# Patient Record
Sex: Female | Born: 1940
Health system: Southern US, Community
[De-identification: ages and names within clinical notes are randomized; demographics above are authoritative.]

## PROBLEM LIST (undated history)

## (undated) DIAGNOSIS — I499 Cardiac arrhythmia, unspecified: Secondary | ICD-10-CM

## (undated) DIAGNOSIS — F32A Depression, unspecified: Secondary | ICD-10-CM

## (undated) DIAGNOSIS — K219 Gastro-esophageal reflux disease without esophagitis: Secondary | ICD-10-CM

## (undated) DIAGNOSIS — G4700 Insomnia, unspecified: Secondary | ICD-10-CM

## (undated) DIAGNOSIS — F419 Anxiety disorder, unspecified: Secondary | ICD-10-CM

## (undated) DIAGNOSIS — K589 Irritable bowel syndrome without diarrhea: Secondary | ICD-10-CM

## (undated) DIAGNOSIS — Z8489 Family history of other specified conditions: Secondary | ICD-10-CM

## (undated) DIAGNOSIS — E7800 Pure hypercholesterolemia, unspecified: Secondary | ICD-10-CM

## (undated) DIAGNOSIS — M1990 Unspecified osteoarthritis, unspecified site: Secondary | ICD-10-CM

## (undated) DIAGNOSIS — I739 Peripheral vascular disease, unspecified: Secondary | ICD-10-CM

## (undated) HISTORY — DX: Gastro-esophageal reflux disease without esophagitis: K21.9

## (undated) HISTORY — PX: KNEE SURGERY: SHX244

## (undated) HISTORY — DX: Peripheral vascular disease, unspecified: I73.9

## (undated) HISTORY — DX: Pure hypercholesterolemia, unspecified: E78.00

## (undated) HISTORY — DX: Irritable bowel syndrome, unspecified: K58.9

## (undated) HISTORY — DX: Insomnia, unspecified: G47.00

## (undated) HISTORY — DX: Anxiety disorder, unspecified: F41.9

---

## 1975-06-06 HISTORY — PX: APPENDECTOMY: SHX54

## 1997-11-23 ENCOUNTER — Other Ambulatory Visit: Admission: RE | Admit: 1997-11-23 | Discharge: 1997-11-23 | Payer: Self-pay | Admitting: Gynecology

## 1998-03-05 ENCOUNTER — Other Ambulatory Visit: Admission: RE | Admit: 1998-03-05 | Discharge: 1998-03-05 | Payer: Self-pay | Admitting: Obstetrics and Gynecology

## 1998-12-04 ENCOUNTER — Other Ambulatory Visit: Admission: RE | Admit: 1998-12-04 | Discharge: 1998-12-04 | Payer: Self-pay | Admitting: Gynecology

## 1999-08-28 HISTORY — PX: ANTERIOR FUSION CERVICAL SPINE: SUR626

## 1999-12-24 ENCOUNTER — Other Ambulatory Visit: Admission: RE | Admit: 1999-12-24 | Discharge: 1999-12-24 | Payer: Self-pay | Admitting: Gynecology

## 2001-01-18 ENCOUNTER — Other Ambulatory Visit: Admission: RE | Admit: 2001-01-18 | Discharge: 2001-01-18 | Payer: Self-pay | Admitting: Gynecology

## 2002-01-18 ENCOUNTER — Other Ambulatory Visit: Admission: RE | Admit: 2002-01-18 | Discharge: 2002-01-18 | Payer: Self-pay | Admitting: Gynecology

## 2002-05-12 ENCOUNTER — Ambulatory Visit (HOSPITAL_BASED_OUTPATIENT_CLINIC_OR_DEPARTMENT_OTHER): Admission: RE | Admit: 2002-05-12 | Discharge: 2002-05-12 | Payer: Self-pay | Admitting: *Deleted

## 2003-01-30 ENCOUNTER — Other Ambulatory Visit: Admission: RE | Admit: 2003-01-30 | Discharge: 2003-01-30 | Payer: Self-pay | Admitting: Obstetrics & Gynecology

## 2004-02-05 ENCOUNTER — Other Ambulatory Visit: Admission: RE | Admit: 2004-02-05 | Discharge: 2004-02-05 | Payer: Self-pay | Admitting: Gynecology

## 2005-02-06 ENCOUNTER — Other Ambulatory Visit: Admission: RE | Admit: 2005-02-06 | Discharge: 2005-02-06 | Payer: Self-pay | Admitting: Gynecology

## 2005-12-30 ENCOUNTER — Encounter: Admission: RE | Admit: 2005-12-30 | Discharge: 2005-12-30 | Payer: Self-pay | Admitting: Orthopaedic Surgery

## 2006-01-13 ENCOUNTER — Encounter: Admission: RE | Admit: 2006-01-13 | Discharge: 2006-01-13 | Payer: Self-pay | Admitting: Orthopaedic Surgery

## 2007-02-12 ENCOUNTER — Encounter: Admission: RE | Admit: 2007-02-12 | Discharge: 2007-02-12 | Payer: Self-pay | Admitting: Gynecology

## 2007-02-18 ENCOUNTER — Encounter: Admission: RE | Admit: 2007-02-18 | Discharge: 2007-02-18 | Payer: Self-pay | Admitting: Gynecology

## 2008-02-14 ENCOUNTER — Encounter: Admission: RE | Admit: 2008-02-14 | Discharge: 2008-02-14 | Payer: Self-pay | Admitting: Gynecology

## 2008-03-23 ENCOUNTER — Encounter: Admission: RE | Admit: 2008-03-23 | Discharge: 2008-03-23 | Payer: Self-pay | Admitting: Orthopaedic Surgery

## 2008-04-10 HISTORY — PX: LIPOMA EXCISION: SHX5283

## 2009-02-14 ENCOUNTER — Encounter: Admission: RE | Admit: 2009-02-14 | Discharge: 2009-02-14 | Payer: Self-pay | Admitting: Gynecology

## 2010-02-19 ENCOUNTER — Encounter
Admission: RE | Admit: 2010-02-19 | Discharge: 2010-02-19 | Payer: Self-pay | Source: Home / Self Care | Attending: Gynecology | Admitting: Gynecology

## 2010-06-14 NOTE — Op Note (Signed)
   NAME:  Penny Henry, Penny Henry                          ACCOUNT NO.:  [id number]   MEDICAL RECORD NO.:  [id number]                   PATIENT TYPE:  AMB   LOCATION:  DSC                                  FACILITY:  [facility]   PHYSICIAN:  Lowell Bouton, M.D.      DATE OF BIRTH:  01/11/1941   DATE OF PROCEDURE:  05/12/2002  DATE OF DISCHARGE:                                 OPERATIVE REPORT   PREOPERATIVE DIAGNOSIS:  Right carpal tunnel syndrome.   POSTOPERATIVE DIAGNOSIS:  Right carpal tunnel syndrome.   PROCEDURE:  Decompression, median nerve, right carpal tunnel.   SURGEON:  Lowell Bouton, M.D.   ANESTHESIA:  0.5% Marcaine local with sedation.   OPERATIVE FINDINGS:  The patient had no masses in the carpal canal.  The  motor branch of the nerve was intact.   DESCRIPTION OF PROCEDURE:  Under 0.5% Marcaine local anesthesia with a  tourniquet on the right arm, the right hand was prepped and draped in the  usual fashion and after exsanguinating the limb, the tourniquet was inflated  to 225 mmHg.  A 3 cm longitudinal incision was made in the palm just ulnar  to the thenar crease.  Sharp dissection was carried through the subcutaneous  tissues.  Blunt dissection was carried to the superficial palmar fascia just  distal to the transverse carpal ligament.  A hemostat was then placed in the  carpal canal up against the hook of the hamate.  The transverse carpal  ligament was divided on the ulnar border of the median nerve.  The proximal  end of the ligament was divided with the scissors after dissecting the nerve  away from the undersurface of the ligament.  The carpal canal was then  palpated and was found to be adequately decompressed.  The wound was  irrigated with saline.  The nerve was examined and the motor branch  identified.  The skin was closed with 4-0 nylon sutures.  Sterile dressings  were applied, followed by a volar wrist splint.  The tourniquet was released  with good circulation to the hand.  The patient went to the recovery room  awake and stable, in good condition.                                               Lowell Bouton, M.D.    EMM/MEDQ  D:  05/13/2002  T:  05/13/2002  Job:  [id number]

## 2011-01-14 ENCOUNTER — Other Ambulatory Visit: Payer: Self-pay | Admitting: Gynecology

## 2011-01-14 DIAGNOSIS — Z1231 Encounter for screening mammogram for malignant neoplasm of breast: Secondary | ICD-10-CM

## 2011-02-21 ENCOUNTER — Ambulatory Visit
Admission: RE | Admit: 2011-02-21 | Discharge: 2011-02-21 | Disposition: A | Discharge status: Home / Self Care | Payer: BC Managed Care – PPO | Source: Ambulatory Visit | Attending: Gynecology | Admitting: Gynecology

## 2011-02-21 DIAGNOSIS — Z1231 Encounter for screening mammogram for malignant neoplasm of breast: Secondary | ICD-10-CM

## 2011-10-02 HISTORY — PX: KNEE ARTHROSCOPY: SUR90

## 2012-02-05 ENCOUNTER — Other Ambulatory Visit: Payer: Self-pay | Admitting: Gynecology

## 2012-02-05 DIAGNOSIS — Z1231 Encounter for screening mammogram for malignant neoplasm of breast: Secondary | ICD-10-CM

## 2012-03-05 ENCOUNTER — Ambulatory Visit
Admission: RE | Admit: 2012-03-05 | Discharge: 2012-03-05 | Disposition: A | Discharge status: Home / Self Care | Payer: BC Managed Care – PPO | Source: Ambulatory Visit | Attending: Gynecology | Admitting: Gynecology

## 2012-03-05 DIAGNOSIS — Z1231 Encounter for screening mammogram for malignant neoplasm of breast: Secondary | ICD-10-CM

## 2013-02-08 ENCOUNTER — Other Ambulatory Visit: Payer: Self-pay

## 2013-02-08 DIAGNOSIS — Z1231 Encounter for screening mammogram for malignant neoplasm of breast: Secondary | ICD-10-CM

## 2013-02-10 ENCOUNTER — Other Ambulatory Visit: Payer: Self-pay | Admitting: Gynecology

## 2013-02-10 DIAGNOSIS — Z780 Asymptomatic menopausal state: Secondary | ICD-10-CM

## 2013-03-08 ENCOUNTER — Ambulatory Visit
Admission: RE | Admit: 2013-03-08 | Discharge: 2013-03-08 | Disposition: A | Discharge status: Home / Self Care | Payer: BC Managed Care – PPO | Source: Ambulatory Visit

## 2013-03-08 ENCOUNTER — Ambulatory Visit
Admission: RE | Admit: 2013-03-08 | Discharge: 2013-03-08 | Disposition: A | Discharge status: Home / Self Care | Payer: BC Managed Care – PPO | Source: Ambulatory Visit | Attending: Gynecology | Admitting: Gynecology

## 2013-03-08 ENCOUNTER — Other Ambulatory Visit: Payer: Self-pay

## 2013-03-08 DIAGNOSIS — Z1231 Encounter for screening mammogram for malignant neoplasm of breast: Secondary | ICD-10-CM

## 2013-03-08 DIAGNOSIS — Z780 Asymptomatic menopausal state: Secondary | ICD-10-CM

## 2013-03-11 ENCOUNTER — Other Ambulatory Visit: Payer: Self-pay | Admitting: Gynecology

## 2013-03-11 DIAGNOSIS — R928 Other abnormal and inconclusive findings on diagnostic imaging of breast: Secondary | ICD-10-CM

## 2013-03-23 ENCOUNTER — Ambulatory Visit
Admission: RE | Admit: 2013-03-23 | Discharge: 2013-03-23 | Disposition: A | Discharge status: Home / Self Care | Payer: BC Managed Care – PPO | Source: Ambulatory Visit | Attending: Gynecology | Admitting: Gynecology

## 2013-03-23 DIAGNOSIS — R928 Other abnormal and inconclusive findings on diagnostic imaging of breast: Secondary | ICD-10-CM

## 2014-02-22 ENCOUNTER — Other Ambulatory Visit: Payer: Self-pay

## 2014-02-22 DIAGNOSIS — Z1231 Encounter for screening mammogram for malignant neoplasm of breast: Secondary | ICD-10-CM

## 2014-03-14 ENCOUNTER — Ambulatory Visit
Admission: RE | Admit: 2014-03-14 | Discharge: 2014-03-14 | Disposition: A | Discharge status: Home / Self Care | Payer: BC Managed Care – PPO | Source: Ambulatory Visit

## 2014-03-14 DIAGNOSIS — Z1231 Encounter for screening mammogram for malignant neoplasm of breast: Secondary | ICD-10-CM

## 2014-08-03 DIAGNOSIS — I471 Supraventricular tachycardia: Secondary | ICD-10-CM | POA: Insufficient documentation

## 2014-08-03 DIAGNOSIS — I493 Ventricular premature depolarization: Secondary | ICD-10-CM | POA: Insufficient documentation

## 2015-02-20 ENCOUNTER — Other Ambulatory Visit: Payer: Self-pay

## 2015-02-20 DIAGNOSIS — Z1231 Encounter for screening mammogram for malignant neoplasm of breast: Secondary | ICD-10-CM

## 2015-03-02 ENCOUNTER — Other Ambulatory Visit: Payer: Self-pay | Admitting: Obstetrics and Gynecology

## 2015-03-02 DIAGNOSIS — E2839 Other primary ovarian failure: Secondary | ICD-10-CM

## 2015-03-16 ENCOUNTER — Ambulatory Visit
Admission: RE | Admit: 2015-03-16 | Discharge: 2015-03-16 | Disposition: A | Discharge status: Home / Self Care | Payer: BC Managed Care – PPO | Source: Ambulatory Visit

## 2015-03-16 ENCOUNTER — Ambulatory Visit
Admission: RE | Admit: 2015-03-16 | Discharge: 2015-03-16 | Disposition: A | Discharge status: Home / Self Care | Payer: BC Managed Care – PPO | Source: Ambulatory Visit | Attending: Obstetrics and Gynecology | Admitting: Obstetrics and Gynecology

## 2015-03-16 DIAGNOSIS — E2839 Other primary ovarian failure: Secondary | ICD-10-CM

## 2015-03-16 DIAGNOSIS — Z1231 Encounter for screening mammogram for malignant neoplasm of breast: Secondary | ICD-10-CM

## 2015-03-22 ENCOUNTER — Other Ambulatory Visit: Payer: Self-pay | Admitting: Obstetrics and Gynecology

## 2015-03-22 DIAGNOSIS — R928 Other abnormal and inconclusive findings on diagnostic imaging of breast: Secondary | ICD-10-CM

## 2015-03-29 ENCOUNTER — Ambulatory Visit
Admission: RE | Admit: 2015-03-29 | Discharge: 2015-03-29 | Disposition: A | Discharge status: Home / Self Care | Payer: BC Managed Care – PPO | Source: Ambulatory Visit | Attending: Obstetrics and Gynecology | Admitting: Obstetrics and Gynecology

## 2015-03-29 DIAGNOSIS — R928 Other abnormal and inconclusive findings on diagnostic imaging of breast: Secondary | ICD-10-CM

## 2016-03-06 ENCOUNTER — Other Ambulatory Visit: Payer: Self-pay | Admitting: Family Medicine

## 2016-03-06 DIAGNOSIS — Z1231 Encounter for screening mammogram for malignant neoplasm of breast: Secondary | ICD-10-CM

## 2016-03-17 ENCOUNTER — Ambulatory Visit
Admission: RE | Admit: 2016-03-17 | Discharge: 2016-03-17 | Disposition: A | Discharge status: Home / Self Care | Payer: Medicare Other | Source: Ambulatory Visit | Attending: Family Medicine | Admitting: Family Medicine

## 2016-03-17 DIAGNOSIS — Z1231 Encounter for screening mammogram for malignant neoplasm of breast: Secondary | ICD-10-CM

## 2017-03-09 ENCOUNTER — Other Ambulatory Visit: Payer: Self-pay | Admitting: Obstetrics and Gynecology

## 2017-03-09 DIAGNOSIS — E2839 Other primary ovarian failure: Secondary | ICD-10-CM

## 2017-03-09 DIAGNOSIS — Z1231 Encounter for screening mammogram for malignant neoplasm of breast: Secondary | ICD-10-CM

## 2017-03-27 ENCOUNTER — Ambulatory Visit
Admission: RE | Admit: 2017-03-27 | Discharge: 2017-03-27 | Disposition: A | Discharge status: Home / Self Care | Payer: Medicare Other | Source: Ambulatory Visit | Attending: Obstetrics and Gynecology | Admitting: Obstetrics and Gynecology

## 2017-03-27 DIAGNOSIS — Z1231 Encounter for screening mammogram for malignant neoplasm of breast: Secondary | ICD-10-CM

## 2017-03-27 DIAGNOSIS — E2839 Other primary ovarian failure: Secondary | ICD-10-CM

## 2017-05-04 ENCOUNTER — Encounter (INDEPENDENT_AMBULATORY_CARE_PROVIDER_SITE_OTHER): Payer: Self-pay | Admitting: Vascular Surgery

## 2017-08-31 ENCOUNTER — Telehealth (INDEPENDENT_AMBULATORY_CARE_PROVIDER_SITE_OTHER): Payer: Self-pay | Admitting: Orthopaedic Surgery

## 2017-08-31 NOTE — Telephone Encounter (Signed)
Patient called asked if she can get another injection in both knees. Patient said she do not know what injections were used. The number to contact patient is [phone]

## 2017-08-31 NOTE — Telephone Encounter (Signed)
Yes that's fine, it was just cortisone injections

## 2017-08-31 NOTE — Telephone Encounter (Signed)
Patient scheduled 09/16/17 with Dr Magnus IvanBlackman

## 2017-09-08 DIAGNOSIS — J309 Allergic rhinitis, unspecified: Secondary | ICD-10-CM | POA: Insufficient documentation

## 2017-09-15 NOTE — Progress Notes (Signed)
Cardiology Office Note:    Date:  09/16/2017   ID:  Penny HartClara L Osburn, DOB 07-06-1940, MRN [id number]  PCP:  Marylynn PearsonBurkhart, Chantee Cerino, MD  Cardiologist:  Norman HerrlichBrian Takita Riecke, MD    Referring MD: Marylynn PearsonBurkhart, Raiden Haydu, MD    ASSESSMENT:    1. PVC (premature ventricular contraction)   2. SVT (supraventricular tachycardia) (HCC)   3. Venous stasis dermatitis of both lower extremities    PLAN:    In order of problems listed above:  1. Stable she has minimal symptoms is pleased with the response to over-the-counter magnesium at this time does not require beta-blocker antiarrhythmic drug.  I asked her to avoid over-the-counter proarrhythmic agents 2. Stable no recurrence off suppressant therapy 3. Stable I gave her elements of care including emollient elevation and compression hose   Next appointment: One year   Medication Adjustments/Labs and Tests Ordered: Current medicines are reviewed at length with the patient today.  Concerns regarding medicines are outlined above.  No orders of the defined types were placed in this encounter.  No orders of the defined types were placed in this encounter.   Chief Complaint  Patient presents with  . Follow-up    PVC's and SVT    History of Present Illness:    Penny Henry is a 77 y.o. female with a hx of SVT PVCs not on suppressant therapy  and palpitation [facility]. Compliance with diet, lifestyle and medications: yes  She has brief momentary palpitation but not frequent or sustained continues to take over-the-counter magnesium that she thinks is helped her.  No chest pain shortness of breath or syncope Past Medical History:  Diagnosis Date  . Acid reflux disease   . Anxiety   . Borderline hypercholesterolemia   . Insomnia   . Irritable bowel syndrome     Past Surgical History:  Procedure Laterality Date  . KNEE SURGERY      Current Medications: Current Meds  Medication Sig  . aspirin EC 81 MG tablet  Take 81 mg by mouth daily.   . Azelastine HCl 0.15 % SOLN   . Bioflavonoid Products (ESTER C PO) Take 1 tablet by mouth daily.   . cetirizine (ZYRTEC) 10 MG tablet Take 10 mg by mouth daily as needed.   . Cholecalciferol (VITAMIN D-3) 5000 units TABS Take 1 tablet by mouth daily.  Marland Kitchen. dicyclomine (BENTYL) 20 MG tablet Take 20 mg by mouth 4 (four) times daily as needed.   Marland Kitchen. escitalopram (LEXAPRO) 10 MG tablet escitalopram 10 mg tablet  . estradiol (ESTRACE) 1 MG tablet estradiol 1 mg tablet  Take 1 tablet(s) every day by oral route.  Marland Kitchen. FIBER ADULT GUMMIES PO Take 1 tablet by mouth daily.  . fluocinonide gel (LIDEX) 0.05 % fluocinonide 0.05 % topical gel  . Glucosamine Sulfate 1000 MG CAPS Take by mouth.  Marland Kitchen. LORazepam (ATIVAN) 1 MG tablet Take 1.5 mg by mouth daily.  . Magnesium 500 MG CAPS Take 1 capsule by mouth daily.   . Multiple Vitamin (MULTI-VITAMINS PO) Take by mouth.  . multivitamin-lutein (OCUVITE-LUTEIN) CAPS capsule Take 1 capsule by mouth daily.  . Omega-3 Fatty Acids (FISH OIL) 1200 MG CPDR Take 1 tablet by mouth 2 (two) times daily.   . progesterone (PROMETRIUM) 100 MG capsule Take 100 mg by mouth daily.  . traMADol (ULTRAM) 50 MG tablet Take by mouth every 6 (six) hours as needed.  . traZODone (DESYREL) 50 MG tablet Take 50 mg by mouth daily.  Allergies:   Doxycycline; Doxycycline hyclate; and Povidone iodine   Social History   Socioeconomic History  . Marital status: Single    Spouse name: Not on file  . Number of children: Not on file  . Years of education: Not on file  . Highest education level: Not on file  Occupational History  . Not on file  Social Needs  . Financial resource strain: Not on file  . Food insecurity:    Worry: Not on file    Inability: Not on file  . Transportation needs:    Medical: Not on file    Non-medical: Not on file  Tobacco Use  . Smoking status: Never Smoker  . Smokeless tobacco: Never Used  Substance and Sexual Activity  .  Alcohol use: Not Currently  . Drug use: Not Currently  . Sexual activity: Not on file  Lifestyle  . Physical activity:    Days per week: Not on file    Minutes per session: Not on file  . Stress: Not on file  Relationships  . Social connections:    Talks on phone: Not on file    Gets together: Not on file    Attends religious service: Not on file    Active member of club or organization: Not on file    Attends meetings of clubs or organizations: Not on file    Relationship status: Not on file  Other Topics Concern  . Not on file  Social History Narrative  . Not on file     Family History: The patient's family history includes Arthritis in her father and mother. ROS:   Please see the history of present illness.    All other systems reviewed and are negative.  EKGs/Labs/Other Studies Reviewed:    The following studies were reviewed today:  EKG:  EKG ordered today.  [facility] and [facility]  Recent Labs: No results found for requested labs within last 8760 hours.  Recent Lipid Panel No results found for: CHOL, TRIG, HDL, CHOLHDL, VLDL, LDLCALC, LDLDIRECT  Physical Exam:    VS:  BP 122/78 (BP Location: Right Arm, Patient Position: Sitting, Cuff Size: Normal)   Pulse 79   Ht 5\' 7"  (1.702 m)   Wt 140 lb (63.5 kg)   SpO2 96%   BMI 21.93 kg/m     Wt Readings from Last 3 Encounters:  09/16/17 140 lb (63.5 kg)     GEN:  Well nourished, well developed in no acute distress HEENT: Normal NECK: No JVD; No carotid bruits LYMPHATICS: No lymphadenopathy CARDIAC: RRR, no murmurs, rubs, gallops RESPIRATORY:  Clear to auscultation without rales, wheezing or rhonchi  ABDOMEN: Soft, non-tender, non-distended MUSCULOSKELETAL:  No edema; No deformity  SKIN: Warm and dry NEUROLOGIC:  Alert and oriented x 3 PSYCHIATRIC:  Normal affect    Signed, Norman HerrlichBrian Mikhail Hallenbeck, MD  09/16/2017 2:47 PM    [facility]

## 2017-09-16 ENCOUNTER — Ambulatory Visit (INDEPENDENT_AMBULATORY_CARE_PROVIDER_SITE_OTHER): Payer: Medicare Other

## 2017-09-16 ENCOUNTER — Ambulatory Visit (INDEPENDENT_AMBULATORY_CARE_PROVIDER_SITE_OTHER): Payer: Medicare Other | Admitting: Orthopaedic Surgery

## 2017-09-16 ENCOUNTER — Ambulatory Visit: Payer: Medicare Other | Admitting: Cardiology

## 2017-09-16 ENCOUNTER — Encounter: Payer: Self-pay | Admitting: Cardiology

## 2017-09-16 ENCOUNTER — Telehealth (INDEPENDENT_AMBULATORY_CARE_PROVIDER_SITE_OTHER): Payer: Self-pay

## 2017-09-16 VITALS — BP 122/78 | HR 79 | Ht 67.0 in | Wt 140.0 lb

## 2017-09-16 DIAGNOSIS — I493 Ventricular premature depolarization: Secondary | ICD-10-CM | POA: Diagnosis not present

## 2017-09-16 DIAGNOSIS — G8929 Other chronic pain: Secondary | ICD-10-CM

## 2017-09-16 DIAGNOSIS — I471 Supraventricular tachycardia: Secondary | ICD-10-CM

## 2017-09-16 DIAGNOSIS — M25562 Pain in left knee: Secondary | ICD-10-CM

## 2017-09-16 DIAGNOSIS — I872 Venous insufficiency (chronic) (peripheral): Secondary | ICD-10-CM | POA: Diagnosis not present

## 2017-09-16 DIAGNOSIS — M1712 Unilateral primary osteoarthritis, left knee: Secondary | ICD-10-CM | POA: Diagnosis not present

## 2017-09-16 NOTE — Progress Notes (Signed)
6+                                                                                                                                                                         Office Visit Note   Patient: Penny Henry           Date of Birth: [birth year]           MRN: [id number] Visit Date: 09/16/2017              Requested by: Marylynn PearsonBurkhart, Brian, MD [address] [place], [place] [address] PCP: Marylynn PearsonBurkhart, Brian, MD   Assessment & Plan: Visit Diagnoses:  1. Chronic pain of left knee   2. Unilateral primary osteoarthritis, left knee     Plan: She is going to continue work on quad strengthening exercises and activity modification.  She is a perfect candidate for steroid injection today in her knee and then a hyaluronic acid injection in her left knee.  This combination is helped significantly in the past and is kept her out of the operating room.  I do feel is medically necessary at this point as well.  She understands the risk and benefits of these injections and tolerated the one in her left knee today with a steroid.  In 4 weeks from now hopefully will place hyaluronic acid in her left knee.  Follow-Up Instructions: Return in about 4 weeks (around 10/14/2017).   Orders:  Orders Placed This Encounter  Procedures  . XR Knee 1-2 Views Left   No orders of the defined types were placed in this encounter.     Procedures: No procedures performed   Clinical Data: No additional findings.   Subjective: Chief Complaint  Patient presents with  . Left Knee - Pain  The patient is someone I have not seen in a while.  She is an acute flareup of knee pain of her left knee.  We have seen her before for this but is been about 2 years.  She is had steroid injections and hyaluronic acid in the past.  She said there is no locking catching but  there is pain and popping in her knee when she goes up and down stairs.  Unfortunately her husband's been diagnosed with prostate cancer and does not have a lot of treatments over the next several weeks and this will require much more walking for her and taking care of him.  HPI  Review of Systems She currently denies any headache, chest pain, shortness of breath, fever, chills, nausea, vomiting.  Objective: Vital Signs: There were no vitals taken for this visit.  Physical Exam She is alert and oriented x3 and in no acute distress Ortho Exam Examination of her left knee  shows lateral joint line tenderness and patellofemoral crepitation with full range of motion.  There is slight valgus malalignment with lateral joint line tenderness.  Knee is ligamentously stable. Specialty Comments:  No specialty comments available.  Imaging: Xr Knee 1-2 Views Left  Result Date: 09/16/2017 2 views of the left knee show no acute findings.  There is mild valgus malalignment with lateral joint space narrowing.  There are periarticular osteophytes throughout the knee and evidence of tricompartmental arthritic changes.    PMFS History: Patient Active Problem List   Diagnosis Date Noted  . Unilateral primary osteoarthritis, left knee 09/16/2017  . Allergic rhinitis 09/08/2017  . PVC (premature ventricular contraction) 08/03/2014  . SVT (supraventricular tachycardia) (HCC) 08/03/2014   Past Medical History:  Diagnosis Date  . Acid reflux disease   . Anxiety   . Borderline hypercholesterolemia   . Insomnia   . Irritable bowel syndrome     Family History  Problem Relation Age of Onset  . Arthritis Mother   . Arthritis Father     Past Surgical History:  Procedure Laterality Date  . KNEE SURGERY     Social History   Occupational History  . Not on file  Tobacco Use  . Smoking status: Never Smoker  . Smokeless tobacco: Never Used  Substance and Sexual Activity  . Alcohol use: Not on file    . Drug use: Not on file  . Sexual activity: Not on file

## 2017-09-16 NOTE — Patient Instructions (Addendum)
Medication Instructions:  Your physician recommends that you continue on your current medications as directed. Please refer to the Current Medication list given to you today.   Labwork: None.  Testing/Procedures: None.  Follow-Up: Your physician wants you to follow-up in: 1 year. You will receive a reminder letter in the mail two months in advance. If you don't receive a letter, please call our office to schedule the follow-up appointment.   Any Other Special Instructions Will Be Listed Below (If Applicable).     If you need a refill on your cardiac medications before your next appointment, please call your pharmacy.     1. Avoid all over-the-counter antihistamines except Claritin/Loratadine and Zyrtec/Cetrizine. 2. Avoid all combination including cold sinus allergies flu decongestant and sleep medications 3. You can use Robitussin DM Mucinex and Mucinex DM for cough. 4. can use Tylenol aspirin ibuprofen and naproxen but no combinations such as sleep or sinus. 

## 2017-09-16 NOTE — Telephone Encounter (Signed)
Left knee gel injection ?

## 2017-09-21 ENCOUNTER — Telehealth (INDEPENDENT_AMBULATORY_CARE_PROVIDER_SITE_OTHER): Payer: Self-pay

## 2017-09-21 NOTE — Telephone Encounter (Signed)
Noted  

## 2017-09-21 NOTE — Telephone Encounter (Signed)
Submitted VOB for SynviscOne, left knee. 

## 2017-09-29 ENCOUNTER — Telehealth (INDEPENDENT_AMBULATORY_CARE_PROVIDER_SITE_OTHER): Payer: Self-pay

## 2017-09-29 NOTE — Telephone Encounter (Signed)
Patient is approved for SynviscOne, left knee. Buy & Bill Covered at 100% after co-pay $90.00 Co-pay No PA required  Appt.scheduled 10/14/2017

## 2017-10-14 ENCOUNTER — Ambulatory Visit (INDEPENDENT_AMBULATORY_CARE_PROVIDER_SITE_OTHER): Payer: Medicare Other | Admitting: Orthopaedic Surgery

## 2017-10-14 ENCOUNTER — Encounter (INDEPENDENT_AMBULATORY_CARE_PROVIDER_SITE_OTHER): Payer: Self-pay | Admitting: Orthopaedic Surgery

## 2017-10-14 DIAGNOSIS — M25562 Pain in left knee: Secondary | ICD-10-CM | POA: Diagnosis not present

## 2017-10-14 DIAGNOSIS — M1712 Unilateral primary osteoarthritis, left knee: Secondary | ICD-10-CM | POA: Diagnosis not present

## 2017-10-14 DIAGNOSIS — G8929 Other chronic pain: Secondary | ICD-10-CM | POA: Insufficient documentation

## 2017-10-14 MED ORDER — HYLAN G-F 20 48 MG/6ML IX SOSY
48.0000 mg | PREFILLED_SYRINGE | INTRA_ARTICULAR | Status: AC | PRN
  Administered 2017-10-14: 48 mg via INTRA_ARTICULAR

## 2017-10-14 NOTE — Progress Notes (Signed)
   Procedure Note  Patient: Penny Henry             Date of Birth: 03/22/1940           MRN: [id number]             Visit Date: 10/14/2017  Procedures: Visit Diagnoses: Chronic pain of left knee  Unilateral primary osteoarthritis, left knee  Large Joint Inj: L knee on 10/14/2017 2:03 PM Indications: pain and diagnostic evaluation Details: 22 G 1.5 in needle, superolateral approach  Arthrogram: No  Medications: 48 mg Hylan 48 MG/6ML Outcome: tolerated well, no immediate complications Procedure, treatment alternatives, risks and benefits explained, specific risks discussed. Consent was given by the patient. Immediately prior to procedure a time out was called to verify the correct patient, procedure, equipment, support staff and site/side marked as required. Patient was prepped and draped in the usual sterile fashion.    The patient is here for scheduled Synvisc 1 injection her left knee to treat the pain from osteoarthritis.  She is had injections like this in the past that have worked greatly.  She is only had a steroid injection.  She denies any swelling today.  She does has pain with activities.  She has well-documented osteoarthritis of her left knee.  On exam there is no effusion on her knee tracks well in terms of the patella.  She has full flexion extension as well.  She does have medial joint line tenderness.  She tolerated the Synvisc 1 injection well.  All question concerns were answered and addressed.  Follow-up will be as needed.

## 2018-03-01 ENCOUNTER — Other Ambulatory Visit: Payer: Self-pay | Admitting: Obstetrics and Gynecology

## 2018-03-01 DIAGNOSIS — Z1231 Encounter for screening mammogram for malignant neoplasm of breast: Secondary | ICD-10-CM

## 2018-03-30 ENCOUNTER — Ambulatory Visit
Admission: RE | Admit: 2018-03-30 | Discharge: 2018-03-30 | Disposition: A | Discharge status: Home / Self Care | Payer: Medicare Other | Source: Ambulatory Visit | Attending: Obstetrics and Gynecology | Admitting: Obstetrics and Gynecology

## 2018-03-30 DIAGNOSIS — Z1231 Encounter for screening mammogram for malignant neoplasm of breast: Secondary | ICD-10-CM

## 2018-05-19 ENCOUNTER — Ambulatory Visit (INDEPENDENT_AMBULATORY_CARE_PROVIDER_SITE_OTHER): Payer: Medicare Other | Admitting: Orthopaedic Surgery

## 2018-06-07 ENCOUNTER — Encounter: Payer: Self-pay | Admitting: Orthopaedic Surgery

## 2018-06-07 ENCOUNTER — Other Ambulatory Visit: Payer: Self-pay

## 2018-06-07 ENCOUNTER — Ambulatory Visit: Payer: Medicare Other | Admitting: Orthopaedic Surgery

## 2018-06-07 VITALS — Ht 67.0 in | Wt 140.0 lb

## 2018-06-07 DIAGNOSIS — M25562 Pain in left knee: Secondary | ICD-10-CM

## 2018-06-07 DIAGNOSIS — G8929 Other chronic pain: Secondary | ICD-10-CM | POA: Diagnosis not present

## 2018-06-07 DIAGNOSIS — M1712 Unilateral primary osteoarthritis, left knee: Secondary | ICD-10-CM

## 2018-06-07 MED ORDER — LIDOCAINE HCL 1 % IJ SOLN
3.0000 mL | INTRAMUSCULAR | Status: AC | PRN
  Administered 2018-06-07: 13:00:00 3 mL

## 2018-06-07 MED ORDER — METHYLPREDNISOLONE ACETATE 40 MG/ML IJ SUSP
40.0000 mg | INTRAMUSCULAR | Status: AC | PRN
  Administered 2018-06-07: 13:00:00 40 mg via INTRA_ARTICULAR

## 2018-06-07 NOTE — Progress Notes (Signed)
Office Visit Note   Patient: Penny Henry           Date of Birth: 1940/03/26           MRN: [id number] Visit Date: 06/07/2018              Requested by: Penny Malta, MD [address] [place], [place] [address] PCP: Penny Malta, MD   Assessment & Plan: Visit Diagnoses:  1. Chronic pain of left knee   2. Unilateral primary osteoarthritis, left knee     Plan: Per her wishes I did provide a steroid injection into her left knee.  Hopefully this will continue to help her for as many months as it has in the past.  Risk benefits of steroid injections were explained in detail.  All question concerns were answered and addressed.  Follow-up will be as needed.  Follow-Up Instructions: Return if symptoms worsen or fail to improve.   Orders:  Orders Placed This Encounter  Procedures   Large Joint Inj: L knee   No orders of the defined types were placed in this encounter.     Procedures: Large Joint Inj: L knee on 06/07/2018 1:27 PM Indications: diagnostic evaluation and pain Details: 22 G 1.5 in needle, superolateral approach  Arthrogram: No  Medications: 3 mL lidocaine 1 %; 40 mg methylPREDNISolone acetate 40 MG/ML Outcome: tolerated well, no immediate complications Procedure, treatment alternatives, risks and benefits explained, specific risks discussed. Consent was given by the patient. Immediately prior to procedure a time out was called to verify the correct patient, procedure, equipment, support staff and site/side marked as required. Patient was prepped and draped in the usual sterile fashion.       Clinical Data: No additional findings.   Subjective: Chief Complaint  Patient presents with   Left Knee - Pain  The patient comes in today with left knee pain and known osteoarthritis in the left knee.  She is 78 years old and very active.  She has been doing a lot of yard work recently due to her husband having significant medical issues.  We last  injected her knee about 8 months ago with a steroid.  She would like to have 1 again today.  She has had no other acute change in her medical status.  She does report significant knee pain and swelling on that left knee.  HPI  Review of Systems She currently denies any headache, chest pain, shortness of breath, fever, chills, nausea, vomiting  Objective: Vital Signs: Ht 5\' 7"  (1.702 m)    Wt 140 lb (63.5 kg)    BMI 21.93 kg/m   Physical Exam She is alert and orient x3 and in no acute distress Ortho Exam Examination of her left knee shows no effusion today but certainly swelling in terms of the bone which is more chronic.  She has good range of motion of that knee but definitely medial and lateral joint line tenderness.  There is no significant malalignment.  The knee feels ligamentously stable with good range of motion. Specialty Comments:  No specialty comments available.  Imaging: No results found.   PMFS History: Patient Active Problem List   Diagnosis Date Noted   Chronic pain of left knee 10/14/2017   Unilateral primary osteoarthritis, left knee 09/16/2017   Stasis dermatitis 09/16/2017   Allergic rhinitis 09/08/2017   PVC (premature ventricular contraction) 08/03/2014   SVT (supraventricular tachycardia) (HCC) 08/03/2014   Past Medical History:  Diagnosis Date   Acid  reflux disease    Anxiety    Borderline hypercholesterolemia    Insomnia    Irritable bowel syndrome     Family History  Problem Relation Age of Onset   Arthritis Mother    Arthritis Father     Past Surgical History:  Procedure Laterality Date   KNEE SURGERY     Social History   Occupational History   Not on file  Tobacco Use   Smoking status: Never Smoker   Smokeless tobacco: Never Used  Substance and Sexual Activity   Alcohol use: Not Currently   Drug use: Not Currently   Sexual activity: Not on file

## 2018-07-28 ENCOUNTER — Ambulatory Visit: Payer: Self-pay

## 2018-07-28 ENCOUNTER — Ambulatory Visit (INDEPENDENT_AMBULATORY_CARE_PROVIDER_SITE_OTHER): Payer: Medicare Other | Admitting: Orthopaedic Surgery

## 2018-07-28 ENCOUNTER — Encounter: Payer: Self-pay | Admitting: Orthopaedic Surgery

## 2018-07-28 ENCOUNTER — Other Ambulatory Visit: Payer: Self-pay

## 2018-07-28 DIAGNOSIS — M25552 Pain in left hip: Secondary | ICD-10-CM

## 2018-07-28 DIAGNOSIS — M79605 Pain in left leg: Secondary | ICD-10-CM | POA: Diagnosis not present

## 2018-07-28 MED ORDER — LIDOCAINE HCL 1 % IJ SOLN
3.0000 mL | INTRAMUSCULAR | Status: AC | PRN
  Administered 2018-07-28: 3 mL

## 2018-07-28 MED ORDER — METHYLPREDNISOLONE 4 MG PO TABS: ORAL_TABLET | ORAL | 0 refills | Status: DC

## 2018-07-28 MED ORDER — METHYLPREDNISOLONE ACETATE 40 MG/ML IJ SUSP
40.0000 mg | INTRAMUSCULAR | Status: AC | PRN
  Administered 2018-07-28: 40 mg via INTRA_ARTICULAR

## 2018-07-28 NOTE — Progress Notes (Signed)
Office Visit Note   Patient: Penny Henry           Date of Birth: 09-08-1940           MRN: [id number] Visit Date: 07/28/2018              Requested by: Buckner MaltaBurgart, Jennifer, MD [address] [place],  [place] [id number] PCP: Buckner MaltaBurgart, Jennifer, MD   Assessment & Plan: Visit Diagnoses:  1. Pain in left hip   2. Pain in left leg     Plan: I want to try a steroid injection today over the trochanteric area and she agreed with this.  I explained the risk and benefits of injections and she tolerated well.  We will also try a 6-day steroid taper to treat the sciatica.  Right now she can adjust follow-up as needed unless things worsen while she still deals with her husband's death.  Follow-Up Instructions: Return if symptoms worsen or fail to improve.   Orders:  Orders Placed This Encounter  Procedures  . Large Joint Inj  . XR HIP UNILAT W OR W/O PELVIS 1V LEFT  . XR Lumbar Spine 2-3 Views   Meds ordered this encounter  Medications  . methylPREDNISolone (MEDROL) 4 MG tablet    Sig: Medrol dose pack. Take as instructed    Dispense:  21 tablet    Refill:  0      Procedures: Large Joint Inj: L greater trochanter on 07/28/2018 4:14 PM Indications: pain and diagnostic evaluation Details: 22 G 1.5 in needle, lateral approach  Arthrogram: No  Medications: 3 mL lidocaine 1 %; 40 mg methylPREDNISolone acetate 40 MG/ML Outcome: tolerated well, no immediate complications Procedure, treatment alternatives, risks and benefits explained, specific risks discussed. Consent was given by the patient. Immediately prior to procedure a time out was called to verify the correct patient, procedure, equipment, support staff and site/side marked as required. Patient was prepped and draped in the usual sterile fashion.       Clinical Data: No additional findings.   Subjective: Chief Complaint  Patient presents with  . Left Hip - Pain  The patient comes in today with chief complaint of left  hip pain of the trochanteric area and sciatica of the left side.  She is unfortunately been dealing with her husband's death who just passed away 1 June.  They have been married for over 60 years.  She denies any groin pain.  Is been rating down her leg at times.  It is been slowly getting worse.  She is not taking medication for this.  She is tearful in the office today as she more and her husband.  She is able to ambulate without an assistive device.  She is younger appearing 78 years old.  She denies any change in bowel bladder function.  HPI  Review of Systems She currently denies any headache, chest pain, shortness of breath, fever, chills, nausea, vomiting  Objective: Vital Signs: There were no vitals taken for this visit.  Physical Exam She is alert and orient x3 and in no acute distress Ortho Exam Examination of her left hip shows pain over the trochanteric area to palpation in the IT band.  She also though does have a positive straight leg raise on the left side.  She has good strength in her bilateral lower extremities and normal sensation. Specialty Comments:  No specialty comments available.  Imaging: Xr Hip Unilat W Or W/o Pelvis 1v Left  Result Date: 07/28/2018  An AP pelvis and lateral left hip shows no acute findings.  The hip joint space is well-maintained and there is no evidence of significant arthritis at all.  Xr Lumbar Spine 2-3 Views  Result Date: 07/28/2018 2 views lumbar spine show a mild degree of scoliosis.  There is significant degenerative disc disease between L3 and L4.    PMFS History: Patient Active Problem List   Diagnosis Date Noted  . Chronic pain of left knee 10/14/2017  . Unilateral primary osteoarthritis, left knee 09/16/2017  . Stasis dermatitis 09/16/2017  . Allergic rhinitis 09/08/2017  . PVC (premature ventricular contraction) 08/03/2014  . SVT (supraventricular tachycardia) ([place]) 08/03/2014   Past Medical History:  Diagnosis Date  . Acid  reflux disease   . Anxiety   . Borderline hypercholesterolemia   . Insomnia   . Irritable bowel syndrome     Family History  Problem Relation Age of Onset  . Arthritis Mother   . Arthritis Father     Past Surgical History:  Procedure Laterality Date  . KNEE SURGERY     Social History   Occupational History  . Not on file  Tobacco Use  . Smoking status: Never Smoker  . Smokeless tobacco: Never Used  Substance and Sexual Activity  . Alcohol use: Not Currently  . Drug use: Not Currently  . Sexual activity: Not on file

## 2018-09-22 ENCOUNTER — Other Ambulatory Visit: Payer: Self-pay | Admitting: Rehabilitation

## 2018-09-22 DIAGNOSIS — M4126 Other idiopathic scoliosis, lumbar region: Secondary | ICD-10-CM

## 2018-10-10 ENCOUNTER — Ambulatory Visit
Admission: RE | Admit: 2018-10-10 | Discharge: 2018-10-10 | Disposition: A | Discharge status: Home / Self Care | Payer: Medicare Other | Source: Ambulatory Visit | Attending: Rehabilitation | Admitting: Rehabilitation

## 2018-10-10 ENCOUNTER — Other Ambulatory Visit: Payer: Self-pay

## 2018-10-10 DIAGNOSIS — M4126 Other idiopathic scoliosis, lumbar region: Secondary | ICD-10-CM

## 2019-02-03 DIAGNOSIS — Z6821 Body mass index (BMI) 21.0-21.9, adult: Secondary | ICD-10-CM | POA: Diagnosis not present

## 2019-02-03 DIAGNOSIS — M6530 Trigger finger, unspecified finger: Secondary | ICD-10-CM | POA: Diagnosis not present

## 2019-02-03 DIAGNOSIS — F4321 Adjustment disorder with depressed mood: Secondary | ICD-10-CM | POA: Diagnosis not present

## 2019-02-16 ENCOUNTER — Ambulatory Visit: Payer: Self-pay

## 2019-02-16 ENCOUNTER — Ambulatory Visit: Payer: Medicare PPO | Admitting: Physician Assistant

## 2019-02-16 ENCOUNTER — Other Ambulatory Visit: Payer: Self-pay

## 2019-02-16 ENCOUNTER — Encounter: Payer: Self-pay | Admitting: Physician Assistant

## 2019-02-16 DIAGNOSIS — M65342 Trigger finger, left ring finger: Secondary | ICD-10-CM

## 2019-02-16 MED ORDER — LIDOCAINE HCL 1 % IJ SOLN
0.5000 mL | INTRAMUSCULAR | Status: AC | PRN
  Administered 2019-02-16: 15:00:00 .5 mL

## 2019-02-16 MED ORDER — METHYLPREDNISOLONE ACETATE 40 MG/ML IJ SUSP
20.0000 mg | INTRAMUSCULAR | Status: AC | PRN
  Administered 2019-02-16: 20 mg

## 2019-02-16 NOTE — Progress Notes (Signed)
   Procedure Note  Patient: Penny Henry             Date of Birth: 05/27/1940           MRN: [id number]             Visit Date: 02/16/2019 HPI: Ms. splenis well-known to Dr. Magnus Ivan service comes in today for left ring finger that is locking up over the last 2 to 3 weeks.  No known injury.  Patient is nondiabetic.  She had a previous trigger finger that did well with the injection.  She feels that it was not there is left ring finger.  Review of systems: Please see HPI otherwise negative or noncontributory.  General: Well-developed well-nourished female no acute distress mood affect appropriate.   Psych: Alert and orient x3 Left hand: Active left ring finger trigger finger.  Palpable nodule at the A1 pulley is tender.  Otherwise no rashes skin lesions ulcerations or other triggering fingers of left hand.  Procedures: Visit Diagnoses:  1. Trigger ring finger of left hand     Hand/UE Inj: L ring A1 for trigger finger on 02/16/2019 3:28 PM Details: 25 G needle, volar approach Medications: 0.5 mL lidocaine 1 %; 20 mg methylPREDNISolone acetate 40 MG/ML Consent was given by the patient. Immediately prior to procedure a time out was called to verify the correct patient, procedure, equipment, support staff and site/side marked as required. Patient was prepped and draped in the usual sterile fashion.     Plan: She will follow-up on an as-needed basis.  Recommend no more than 3 injections for the trigger finger and then would recommend release if it continues.  Questions were encouraged and answered.

## 2019-02-18 DIAGNOSIS — H353132 Nonexudative age-related macular degeneration, bilateral, intermediate dry stage: Secondary | ICD-10-CM | POA: Diagnosis not present

## 2019-02-18 DIAGNOSIS — H2513 Age-related nuclear cataract, bilateral: Secondary | ICD-10-CM | POA: Diagnosis not present

## 2019-03-10 DIAGNOSIS — H2512 Age-related nuclear cataract, left eye: Secondary | ICD-10-CM | POA: Diagnosis not present

## 2019-03-10 DIAGNOSIS — H25812 Combined forms of age-related cataract, left eye: Secondary | ICD-10-CM | POA: Diagnosis not present

## 2019-03-16 ENCOUNTER — Other Ambulatory Visit: Payer: Self-pay | Admitting: Obstetrics and Gynecology

## 2019-03-16 DIAGNOSIS — E2839 Other primary ovarian failure: Secondary | ICD-10-CM

## 2019-03-16 DIAGNOSIS — Z1231 Encounter for screening mammogram for malignant neoplasm of breast: Secondary | ICD-10-CM

## 2019-03-21 HISTORY — PX: CATARACT EXTRACTION W/ INTRAOCULAR LENS IMPLANT: SHX1309

## 2019-03-22 DIAGNOSIS — F4321 Adjustment disorder with depressed mood: Secondary | ICD-10-CM | POA: Diagnosis not present

## 2019-03-22 DIAGNOSIS — K580 Irritable bowel syndrome with diarrhea: Secondary | ICD-10-CM | POA: Diagnosis not present

## 2019-03-22 DIAGNOSIS — Z6821 Body mass index (BMI) 21.0-21.9, adult: Secondary | ICD-10-CM | POA: Diagnosis not present

## 2019-03-22 DIAGNOSIS — K219 Gastro-esophageal reflux disease without esophagitis: Secondary | ICD-10-CM | POA: Diagnosis not present

## 2019-03-22 DIAGNOSIS — M4716 Other spondylosis with myelopathy, lumbar region: Secondary | ICD-10-CM | POA: Diagnosis not present

## 2019-03-24 DIAGNOSIS — M545 Low back pain: Secondary | ICD-10-CM | POA: Diagnosis not present

## 2019-03-24 DIAGNOSIS — Z79899 Other long term (current) drug therapy: Secondary | ICD-10-CM | POA: Diagnosis not present

## 2019-03-24 DIAGNOSIS — M5416 Radiculopathy, lumbar region: Secondary | ICD-10-CM | POA: Diagnosis not present

## 2019-03-24 DIAGNOSIS — Z79891 Long term (current) use of opiate analgesic: Secondary | ICD-10-CM | POA: Diagnosis not present

## 2019-03-24 DIAGNOSIS — M4716 Other spondylosis with myelopathy, lumbar region: Secondary | ICD-10-CM | POA: Diagnosis not present

## 2019-03-24 DIAGNOSIS — G894 Chronic pain syndrome: Secondary | ICD-10-CM | POA: Diagnosis not present

## 2019-04-07 DIAGNOSIS — H35363 Drusen (degenerative) of macula, bilateral: Secondary | ICD-10-CM | POA: Diagnosis not present

## 2019-04-07 DIAGNOSIS — H35453 Secondary pigmentary degeneration, bilateral: Secondary | ICD-10-CM | POA: Diagnosis not present

## 2019-04-07 DIAGNOSIS — H35722 Serous detachment of retinal pigment epithelium, left eye: Secondary | ICD-10-CM | POA: Diagnosis not present

## 2019-04-07 DIAGNOSIS — H5315 Visual distortions of shape and size: Secondary | ICD-10-CM | POA: Diagnosis not present

## 2019-04-07 DIAGNOSIS — Z961 Presence of intraocular lens: Secondary | ICD-10-CM | POA: Diagnosis not present

## 2019-04-07 DIAGNOSIS — H25811 Combined forms of age-related cataract, right eye: Secondary | ICD-10-CM | POA: Diagnosis not present

## 2019-04-07 DIAGNOSIS — H353132 Nonexudative age-related macular degeneration, bilateral, intermediate dry stage: Secondary | ICD-10-CM | POA: Diagnosis not present

## 2019-04-19 DIAGNOSIS — Z9229 Personal history of other drug therapy: Secondary | ICD-10-CM | POA: Diagnosis not present

## 2019-04-19 DIAGNOSIS — F4321 Adjustment disorder with depressed mood: Secondary | ICD-10-CM | POA: Diagnosis not present

## 2019-04-19 DIAGNOSIS — Z6821 Body mass index (BMI) 21.0-21.9, adult: Secondary | ICD-10-CM | POA: Diagnosis not present

## 2019-04-19 DIAGNOSIS — T148XXA Other injury of unspecified body region, initial encounter: Secondary | ICD-10-CM | POA: Diagnosis not present

## 2019-04-19 DIAGNOSIS — K219 Gastro-esophageal reflux disease without esophagitis: Secondary | ICD-10-CM | POA: Diagnosis not present

## 2019-05-25 ENCOUNTER — Ambulatory Visit: Payer: Medicare PPO | Admitting: Orthopaedic Surgery

## 2019-05-25 ENCOUNTER — Other Ambulatory Visit: Payer: Self-pay

## 2019-05-25 ENCOUNTER — Encounter: Payer: Self-pay | Admitting: Orthopaedic Surgery

## 2019-05-25 DIAGNOSIS — G8929 Other chronic pain: Secondary | ICD-10-CM | POA: Diagnosis not present

## 2019-05-25 DIAGNOSIS — M1712 Unilateral primary osteoarthritis, left knee: Secondary | ICD-10-CM

## 2019-05-25 DIAGNOSIS — M25562 Pain in left knee: Secondary | ICD-10-CM

## 2019-05-25 MED ORDER — LIDOCAINE HCL 1 % IJ SOLN
3.0000 mL | INTRAMUSCULAR | Status: AC | PRN
  Administered 2019-05-25: 3 mL

## 2019-05-25 MED ORDER — METHYLPREDNISOLONE ACETATE 40 MG/ML IJ SUSP
40.0000 mg | INTRAMUSCULAR | Status: AC | PRN
  Administered 2019-05-25: 40 mg via INTRA_ARTICULAR

## 2019-05-25 NOTE — Progress Notes (Signed)
Office Visit Note   Patient: Penny Henry           Date of Birth: [birth year]           MRN: [id number] Visit Date: 05/25/2019              Requested by: Serita Grammes, MD [address] [place],   [id number] PCP: Serita Grammes, MD   Assessment & Plan: Visit Diagnoses:  1. Chronic pain of left knee   2. Unilateral primary osteoarthritis, left knee     Plan: I was able to aspirate about 20 cc of clear fluid from the knee.  I then placed a steroid in the left knee joint per her request.  Having had before she is fully aware of the risk benefits of injections.  All questions and concerns were answered and addressed.  Follow-up will be as needed.  Follow-Up Instructions: Return if symptoms worsen or fail to improve.   Orders:  Orders Placed This Encounter  Procedures  . Large Joint Inj   No orders of the defined types were placed in this encounter.     Procedures: Large Joint Inj: L knee on 05/25/2019 2:19 PM Indications: diagnostic evaluation and pain Details: 22 G 1.5 in needle, superolateral approach  Arthrogram: No  Medications: 3 mL lidocaine 1 %; 40 mg methylPREDNISolone acetate 40 MG/ML Outcome: tolerated well, no immediate complications Procedure, treatment alternatives, risks and benefits explained, specific risks discussed. Consent was given by the patient. Immediately prior to procedure a time out was called to verify the correct patient, procedure, equipment, support staff and site/side marked as required. Patient was prepped and draped in the usual sterile fashion.       Clinical Data: No additional findings.   Subjective: Chief Complaint  Patient presents with  . Left Knee - Follow-up  The patient is well-known to me.  She is a 79 year old female that I have been following for many years for left knee pain and osteoarthritis.  It has been at least 11 months since I injected her knee before.  She does report an acute flareup of her left  knee pain for several weeks now with some swelling more so in the evening.  She is now a widower and has a lot more work and is putting more weight through her knees and more activities through her knees.  She has had no acute change in her medical status.  HPI  Review of Systems She currently denies any headache, chest pain, shortness of breath, fever, chills, nausea, vomiting  Objective: Vital Signs: There were no vitals taken for this visit.  Physical Exam She is alert and oriented x3 and in no acute distress Ortho Exam Examination of her left knee does show just a mild effusion.  She has medial lateral joint line tenderness.  Her knee is ligamentously stable with full range of motion. Specialty Comments:  No specialty comments available.  Imaging: No results found.   PMFS History: Patient Active Problem List   Diagnosis Date Noted  . Chronic pain of left knee 10/14/2017  . Unilateral primary osteoarthritis, left knee 09/16/2017  . Stasis dermatitis 09/16/2017  . Allergic rhinitis 09/08/2017  . PVC (premature ventricular contraction) 08/03/2014  . SVT (supraventricular tachycardia) ([place]) 08/03/2014   Past Medical History:  Diagnosis Date  . Acid reflux disease   . Anxiety   . Borderline hypercholesterolemia   . Insomnia   . Irritable bowel syndrome     Family History  Problem Relation Age of Onset  . Arthritis Mother   . Arthritis Father     Past Surgical History:  Procedure Laterality Date  . KNEE SURGERY     Social History   Occupational History  . Not on file  Tobacco Use  . Smoking status: Never Smoker  . Smokeless tobacco: Never Used  Substance and Sexual Activity  . Alcohol use: Not Currently  . Drug use: Not Currently  . Sexual activity: Not on file

## 2019-06-03 ENCOUNTER — Ambulatory Visit
Admission: RE | Admit: 2019-06-03 | Discharge: 2019-06-03 | Disposition: A | Discharge status: Home / Self Care | Payer: Medicare PPO | Source: Ambulatory Visit | Attending: Obstetrics and Gynecology | Admitting: Obstetrics and Gynecology

## 2019-06-03 ENCOUNTER — Other Ambulatory Visit: Payer: Self-pay

## 2019-06-03 DIAGNOSIS — E2839 Other primary ovarian failure: Secondary | ICD-10-CM

## 2019-06-03 DIAGNOSIS — Z1231 Encounter for screening mammogram for malignant neoplasm of breast: Secondary | ICD-10-CM

## 2019-06-03 DIAGNOSIS — M8589 Other specified disorders of bone density and structure, multiple sites: Secondary | ICD-10-CM | POA: Diagnosis not present

## 2019-06-09 DIAGNOSIS — Z01419 Encounter for gynecological examination (general) (routine) without abnormal findings: Secondary | ICD-10-CM | POA: Diagnosis not present

## 2019-06-13 DIAGNOSIS — H35722 Serous detachment of retinal pigment epithelium, left eye: Secondary | ICD-10-CM | POA: Diagnosis not present

## 2019-06-13 DIAGNOSIS — H5315 Visual distortions of shape and size: Secondary | ICD-10-CM | POA: Diagnosis not present

## 2019-06-13 DIAGNOSIS — H353131 Nonexudative age-related macular degeneration, bilateral, early dry stage: Secondary | ICD-10-CM | POA: Diagnosis not present

## 2019-06-13 DIAGNOSIS — H25813 Combined forms of age-related cataract, bilateral: Secondary | ICD-10-CM | POA: Diagnosis not present

## 2019-06-13 DIAGNOSIS — H35363 Drusen (degenerative) of macula, bilateral: Secondary | ICD-10-CM | POA: Diagnosis not present

## 2019-06-13 DIAGNOSIS — H35453 Secondary pigmentary degeneration, bilateral: Secondary | ICD-10-CM | POA: Diagnosis not present

## 2019-07-22 DIAGNOSIS — H8113 Benign paroxysmal vertigo, bilateral: Secondary | ICD-10-CM | POA: Diagnosis not present

## 2019-07-22 DIAGNOSIS — H6983 Other specified disorders of Eustachian tube, bilateral: Secondary | ICD-10-CM | POA: Diagnosis not present

## 2019-07-22 DIAGNOSIS — Z6821 Body mass index (BMI) 21.0-21.9, adult: Secondary | ICD-10-CM | POA: Diagnosis not present

## 2019-07-22 DIAGNOSIS — F4321 Adjustment disorder with depressed mood: Secondary | ICD-10-CM | POA: Diagnosis not present

## 2019-08-29 DIAGNOSIS — Z13220 Encounter for screening for lipoid disorders: Secondary | ICD-10-CM | POA: Diagnosis not present

## 2019-08-29 DIAGNOSIS — Z0000 Encounter for general adult medical examination without abnormal findings: Secondary | ICD-10-CM | POA: Diagnosis not present

## 2019-08-29 DIAGNOSIS — K219 Gastro-esophageal reflux disease without esophagitis: Secondary | ICD-10-CM | POA: Diagnosis not present

## 2019-08-29 DIAGNOSIS — J309 Allergic rhinitis, unspecified: Secondary | ICD-10-CM | POA: Diagnosis not present

## 2019-08-29 DIAGNOSIS — Z6821 Body mass index (BMI) 21.0-21.9, adult: Secondary | ICD-10-CM | POA: Diagnosis not present

## 2019-08-29 DIAGNOSIS — L989 Disorder of the skin and subcutaneous tissue, unspecified: Secondary | ICD-10-CM | POA: Diagnosis not present

## 2019-08-29 DIAGNOSIS — F4321 Adjustment disorder with depressed mood: Secondary | ICD-10-CM | POA: Diagnosis not present

## 2019-08-29 DIAGNOSIS — Z79899 Other long term (current) drug therapy: Secondary | ICD-10-CM | POA: Diagnosis not present

## 2019-08-29 DIAGNOSIS — E785 Hyperlipidemia, unspecified: Secondary | ICD-10-CM | POA: Diagnosis not present

## 2019-08-29 DIAGNOSIS — Z131 Encounter for screening for diabetes mellitus: Secondary | ICD-10-CM | POA: Diagnosis not present

## 2019-09-22 DIAGNOSIS — C4442 Squamous cell carcinoma of skin of scalp and neck: Secondary | ICD-10-CM | POA: Diagnosis not present

## 2019-09-22 DIAGNOSIS — L080 Pyoderma: Secondary | ICD-10-CM | POA: Diagnosis not present

## 2019-09-22 DIAGNOSIS — D485 Neoplasm of uncertain behavior of skin: Secondary | ICD-10-CM | POA: Diagnosis not present

## 2019-10-07 DIAGNOSIS — C4442 Squamous cell carcinoma of skin of scalp and neck: Secondary | ICD-10-CM | POA: Diagnosis not present

## 2019-10-12 DIAGNOSIS — H35453 Secondary pigmentary degeneration, bilateral: Secondary | ICD-10-CM | POA: Diagnosis not present

## 2019-10-12 DIAGNOSIS — H353131 Nonexudative age-related macular degeneration, bilateral, early dry stage: Secondary | ICD-10-CM | POA: Diagnosis not present

## 2019-10-12 DIAGNOSIS — H5315 Visual distortions of shape and size: Secondary | ICD-10-CM | POA: Diagnosis not present

## 2019-10-12 DIAGNOSIS — H35363 Drusen (degenerative) of macula, bilateral: Secondary | ICD-10-CM | POA: Diagnosis not present

## 2019-10-12 DIAGNOSIS — H35722 Serous detachment of retinal pigment epithelium, left eye: Secondary | ICD-10-CM | POA: Diagnosis not present

## 2019-10-12 DIAGNOSIS — Z961 Presence of intraocular lens: Secondary | ICD-10-CM | POA: Diagnosis not present

## 2019-11-01 ENCOUNTER — Ambulatory Visit: Payer: Medicare PPO | Admitting: Orthopaedic Surgery

## 2019-11-01 ENCOUNTER — Telehealth: Payer: Self-pay

## 2019-11-01 DIAGNOSIS — M25562 Pain in left knee: Secondary | ICD-10-CM

## 2019-11-01 DIAGNOSIS — M1711 Unilateral primary osteoarthritis, right knee: Secondary | ICD-10-CM

## 2019-11-01 DIAGNOSIS — G8929 Other chronic pain: Secondary | ICD-10-CM

## 2019-11-01 DIAGNOSIS — M25512 Pain in left shoulder: Secondary | ICD-10-CM | POA: Diagnosis not present

## 2019-11-01 DIAGNOSIS — M1712 Unilateral primary osteoarthritis, left knee: Secondary | ICD-10-CM | POA: Diagnosis not present

## 2019-11-01 MED ORDER — METHYLPREDNISOLONE ACETATE 40 MG/ML IJ SUSP
40.0000 mg | INTRAMUSCULAR | Status: AC | PRN
  Administered 2019-11-01: 40 mg via INTRA_ARTICULAR

## 2019-11-01 MED ORDER — LIDOCAINE HCL 1 % IJ SOLN
3.0000 mL | INTRAMUSCULAR | Status: AC | PRN
  Administered 2019-11-01: 3 mL

## 2019-11-01 NOTE — Progress Notes (Signed)
Office Visit Note   Patient: Penny Henry           Date of Birth: December 29, 1940           MRN: [id number] Visit Date: 11/01/2019              Requested by: Buckner Malta, MD [address] [place],  [place] [address] PCP: Buckner Malta, MD   Assessment & Plan: Visit Diagnoses:  1. Unilateral primary osteoarthritis, left knee   2. Chronic pain of left knee   3. Chronic left shoulder pain     Plan: She is definitely a candidate for hyaluronic acid again for her left knee to treat the pain from osteoarthritis given the success of this in the past.  We did place a steroid injection in her left knee today to temporize her acute pain while we wait for approval for hyaluronic acid for the left knee.  Also placed a steroid injection in her left shoulder.  All question concerns were answered and addressed.  Follow-Up Instructions: Return in about 4 weeks (around 11/29/2019).   Orders:  Orders Placed This Encounter  Procedures  . Large Joint Inj  . Large Joint Inj   No orders of the defined types were placed in this encounter.     Procedures: Large Joint Inj: L knee on 11/01/2019 9:23 AM Indications: diagnostic evaluation and pain Details: 22 G 1.5 in needle, superolateral approach  Arthrogram: No  Medications: 3 mL lidocaine 1 %; 40 mg methylPREDNISolone acetate 40 MG/ML Outcome: tolerated well, no immediate complications Procedure, treatment alternatives, risks and benefits explained, specific risks discussed. Consent was given by the patient. Immediately prior to procedure a time out was called to verify the correct patient, procedure, equipment, support staff and site/side marked as required. Patient was prepped and draped in the usual sterile fashion.   Large Joint Inj: L subacromial bursa on 11/01/2019 9:23 AM Indications: pain and diagnostic evaluation Details: 22 G 1.5 in needle  Arthrogram: No  Medications: 3 mL lidocaine 1 %; 40 mg methylPREDNISolone acetate  40 MG/ML Outcome: tolerated well, no immediate complications Procedure, treatment alternatives, risks and benefits explained, specific risks discussed. Consent was given by the patient. Immediately prior to procedure a time out was called to verify the correct patient, procedure, equipment, support staff and site/side marked as required. Patient was prepped and draped in the usual sterile fashion.       Clinical Data: No additional findings.   Subjective: Chief Complaint  Patient presents with  . Left Knee - Pain  The patient is an 79 year old female well-known to me.  She has known osteoarthritis of her left knee.  She states that hyaluronic acid injections have helped her greatly in the past.  It has been over 6 months since her last injection in October quite a bit.  She has had an acute flareup of pain in her left knee.  She would like to be scheduled again for hyaluronic acid injection.  She hopes at least a steroid injection may temporize her pain short-term while she waits for approval for hyaluronic acid.  She has been doing some outside work and has been having some left shoulder pain would also like a left shoulder injection.  She has had no acute change in medical status otherwise.  She is not a diabetic.  HPI  Review of Systems She currently denies any headache, chest pain, shortness of breath, fever, chills, nausea, vomiting  Objective: Vital Signs: There were  no vitals taken for this visit.  Physical Exam She is alert and orient x3 and in no acute distress Ortho Exam Examination of her left shoulder does show positive signs of impingement and some slight weakness in the cuff.  The shoulder is well located and she has pain mainly reaching overhead and reaching behind.  Examination of her left knee shows just a mild effusion.  There is patellofemoral crepitation and medial as well as lateral joint line tenderness.  The range of motion is full and the knee is ligamentously  stable. Specialty Comments:  No specialty comments available.  Imaging: No results found.   PMFS History: Patient Active Problem List   Diagnosis Date Noted  . Chronic pain of left knee 10/14/2017  . Unilateral primary osteoarthritis, left knee 09/16/2017  . Stasis dermatitis 09/16/2017  . Allergic rhinitis 09/08/2017  . PVC (premature ventricular contraction) 08/03/2014  . SVT (supraventricular tachycardia) ([facility]) 08/03/2014   Past Medical History:  Diagnosis Date  . Acid reflux disease   . Anxiety   . Borderline hypercholesterolemia   . Insomnia   . Irritable bowel syndrome     Family History  Problem Relation Age of Onset  . Arthritis Mother   . Arthritis Father     Past Surgical History:  Procedure Laterality Date  . KNEE SURGERY     Social History   Occupational History  . Not on file  Tobacco Use  . Smoking status: Never Smoker  . Smokeless tobacco: Never Used  Substance and Sexual Activity  . Alcohol use: Not Currently  . Drug use: Not Currently  . Sexual activity: Not on file

## 2019-11-01 NOTE — Telephone Encounter (Signed)
Left knee gel injection ?

## 2019-11-03 NOTE — Telephone Encounter (Signed)
Noted  

## 2019-11-04 NOTE — Telephone Encounter (Signed)
Can you submit this please?

## 2019-11-04 NOTE — Telephone Encounter (Signed)
Submitted for VOB for Monovisc-left knee  

## 2019-11-07 ENCOUNTER — Telehealth: Payer: Self-pay

## 2019-11-07 NOTE — Telephone Encounter (Signed)
She should wait at least 4 weeks.

## 2019-11-07 NOTE — Telephone Encounter (Signed)
Lvm for pt to call back to inform her of approval and copay 

## 2019-11-07 NOTE — Telephone Encounter (Signed)
Patient is asking how long she should wait to get the gel injection after getting the cortisone injection?

## 2019-11-07 NOTE — Telephone Encounter (Signed)
Approved for Monovisc-Left knee  Dr. Kathrin Greathouse and Bill $ 40 copay No OOP Prior auth required with [employer] auth # [id number] Dates: 11/07/19-05/07/20  Ok to schedule @ next available

## 2019-11-08 NOTE — Telephone Encounter (Signed)
Patient aware.

## 2019-11-28 DIAGNOSIS — R030 Elevated blood-pressure reading, without diagnosis of hypertension: Secondary | ICD-10-CM | POA: Diagnosis not present

## 2019-11-28 DIAGNOSIS — Z6822 Body mass index (BMI) 22.0-22.9, adult: Secondary | ICD-10-CM | POA: Diagnosis not present

## 2019-11-28 DIAGNOSIS — Z789 Other specified health status: Secondary | ICD-10-CM | POA: Diagnosis not present

## 2019-11-28 DIAGNOSIS — Z23 Encounter for immunization: Secondary | ICD-10-CM | POA: Diagnosis not present

## 2019-11-28 DIAGNOSIS — F4322 Adjustment disorder with anxiety: Secondary | ICD-10-CM | POA: Diagnosis not present

## 2019-11-28 DIAGNOSIS — Z9181 History of falling: Secondary | ICD-10-CM | POA: Diagnosis not present

## 2019-11-29 ENCOUNTER — Ambulatory Visit (INDEPENDENT_AMBULATORY_CARE_PROVIDER_SITE_OTHER): Payer: Medicare PPO

## 2019-11-29 ENCOUNTER — Ambulatory Visit: Payer: Medicare PPO | Admitting: Orthopaedic Surgery

## 2019-11-29 ENCOUNTER — Encounter: Payer: Self-pay | Admitting: Orthopaedic Surgery

## 2019-11-29 DIAGNOSIS — M25512 Pain in left shoulder: Secondary | ICD-10-CM | POA: Diagnosis not present

## 2019-11-29 DIAGNOSIS — G8929 Other chronic pain: Secondary | ICD-10-CM

## 2019-11-29 DIAGNOSIS — M1712 Unilateral primary osteoarthritis, left knee: Secondary | ICD-10-CM

## 2019-11-29 MED ORDER — HYALURONAN 88 MG/4ML IX SOSY
88.0000 mg | PREFILLED_SYRINGE | INTRA_ARTICULAR | Status: AC | PRN
  Administered 2019-11-29: 88 mg via INTRA_ARTICULAR

## 2019-11-29 NOTE — Progress Notes (Signed)
Office Visit Note   Patient: Penny Henry           Date of Birth: 1940-04-20           MRN: [id number] Visit Date: 11/29/2019              Requested by: Buckner Malta, MD [address] [place],  [place] [address] PCP: Buckner Malta, MD   Assessment & Plan: Visit Diagnoses:  1. Chronic left shoulder pain   2. Unilateral primary osteoarthritis, left knee     Plan: I did provide a Monovisc injection in her left knee today which she tolerated well.  She knows to wait at least 6 months between those injections and this is helped her quite a bit in the past.  As far as her left shoulder goes, a MRI at this point is warranted to truly assess the rotator cuff so we can come up with a treatment plan of how to best treat her rotator cuff deficient shoulder.  We will see her back after this MRI.  All question concerns were answered and addressed.  Follow-Up Instructions: No follow-ups on file.  The patient will call for follow-up appointment when she has a date of her MRI established.  Orders:  Orders Placed This Encounter  Procedures  . Large Joint Inj  . XR Shoulder Left   No orders of the defined types were placed in this encounter.     Procedures: Large Joint Inj: L knee on 11/29/2019 10:37 AM Indications: diagnostic evaluation and pain Details: 22 G 1.5 in needle, superolateral approach  Arthrogram: No  Medications: 88 mg Hyaluronan 88 MG/4ML Outcome: tolerated well, no immediate complications Procedure, treatment alternatives, risks and benefits explained, specific risks discussed. Consent was given by the patient. Immediately prior to procedure a time out was called to verify the correct patient, procedure, equipment, support staff and site/side marked as required. Patient was prepped and draped in the usual sterile fashion.       Clinical Data: No additional findings.   Subjective: Chief Complaint  Patient presents with  . Left Knee - Follow-up  . Left  Shoulder - Pain  The patient is here today for two different issues.  She has a scheduled visit for placing hyaluronic acid in her left knee to treat the pain from osteoarthritis.  She has tried and failed other conservative treatment measures and hyaluronic acid has helped her quite a bit in that left knee in the past.  She also needs to be seen for her left shoulder today.  I have injected the left shoulder subacromial space before.  She reports pain and weakness with overhead activities.  This is been going on for a long period of time.  She is an active 79 year old female.  She has not injured this shoulder before or had surgery on the shoulder.  Her left knee is stable other than pain along the medial aspect and she does walk with a limp.  She has known well-documented osteoarthritis of the left knee.   HPI  Review of Systems There is currently no headache, chest pain, shortness of breath, fever, chills, nausea, vomiting  Objective: Vital Signs: There were no vitals taken for this visit.  Physical Exam She is alert and orient x3 and in no acute distress Ortho Exam Examination of her left shoulder shows weakness of the rotator cuff and she is using her deltoids more so to abduct her shoulder and forward flex the shoulder.  Examination  left knee shows no effusion and global tenderness with good range of motion. Specialty Comments:  No specialty comments available.  Imaging: XR Shoulder Left  Result Date: 11/29/2019 3 views of the left shoulder show no acute findings.  The glenohumeral joint is well located with adequate space.  The humeral head is slightly high riding with a decrease in the subacromial outlet.    PMFS History: Patient Active Problem List   Diagnosis Date Noted  . Chronic pain of left knee 10/14/2017  . Unilateral primary osteoarthritis, left knee 09/16/2017  . Stasis dermatitis 09/16/2017  . Allergic rhinitis 09/08/2017  . PVC (premature ventricular contraction)  08/03/2014  . SVT (supraventricular tachycardia) ([facility]) 08/03/2014   Past Medical History:  Diagnosis Date  . Acid reflux disease   . Anxiety   . Borderline hypercholesterolemia   . Insomnia   . Irritable bowel syndrome     Family History  Problem Relation Age of Onset  . Arthritis Mother   . Arthritis Father     Past Surgical History:  Procedure Laterality Date  . KNEE SURGERY     Social History   Occupational History  . Not on file  Tobacco Use  . Smoking status: Never Smoker  . Smokeless tobacco: Never Used  Substance and Sexual Activity  . Alcohol use: Not Currently  . Drug use: Not Currently  . Sexual activity: Not on file

## 2019-11-30 ENCOUNTER — Other Ambulatory Visit: Payer: Self-pay

## 2019-11-30 DIAGNOSIS — Z96612 Presence of left artificial shoulder joint: Secondary | ICD-10-CM

## 2019-12-05 ENCOUNTER — Telehealth: Payer: Self-pay | Admitting: *Deleted

## 2019-12-05 ENCOUNTER — Telehealth: Payer: Self-pay

## 2019-12-05 NOTE — Telephone Encounter (Signed)
Pt aware of appt.

## 2019-12-05 NOTE — Telephone Encounter (Signed)
Patient called in returning missed call 

## 2019-12-05 NOTE — Telephone Encounter (Signed)
Pt is scheduled at [facility] on Nov 9 at 3p with 245pm arrival. Tried calling pt no answer

## 2019-12-06 DIAGNOSIS — M75122 Complete rotator cuff tear or rupture of left shoulder, not specified as traumatic: Secondary | ICD-10-CM | POA: Diagnosis not present

## 2019-12-06 DIAGNOSIS — Z96612 Presence of left artificial shoulder joint: Secondary | ICD-10-CM | POA: Diagnosis not present

## 2019-12-06 DIAGNOSIS — M25512 Pain in left shoulder: Secondary | ICD-10-CM | POA: Diagnosis not present

## 2019-12-06 DIAGNOSIS — M25412 Effusion, left shoulder: Secondary | ICD-10-CM | POA: Diagnosis not present

## 2019-12-08 ENCOUNTER — Encounter: Payer: Self-pay | Admitting: Orthopaedic Surgery

## 2019-12-08 ENCOUNTER — Ambulatory Visit: Payer: Medicare PPO | Admitting: Orthopaedic Surgery

## 2019-12-08 DIAGNOSIS — G8929 Other chronic pain: Secondary | ICD-10-CM | POA: Diagnosis not present

## 2019-12-08 DIAGNOSIS — M75122 Complete rotator cuff tear or rupture of left shoulder, not specified as traumatic: Secondary | ICD-10-CM | POA: Diagnosis not present

## 2019-12-08 DIAGNOSIS — M25512 Pain in left shoulder: Secondary | ICD-10-CM | POA: Diagnosis not present

## 2019-12-08 NOTE — Progress Notes (Signed)
The patient comes in today to go over a MRI of her left shoulder.  She is a very active individual and I felt that she had a full-thickness rotator cuff tear based on my exam.  She is 79 years old.  She does have limited mobility of that shoulder and pain with daily basis.  We have tried conservative treatment with a steroid injection and activity modification as well as exercises.  She can only take Tylenol as an anti-inflammatory.  She has been on tramadol as well.  Examination of the left shoulder still shows deficits of the rotator cuff.  The MRI does reveal that she has a full-thickness retracted rotator cuff tear of the supraspinatus tendon.  There is also moderate arthritis of the [facility] joint.  There is a type I acromion.  There are some mild synovitis in the glenohumeral joint and mild osteoarthritis of the glenohumeral joint.  At this point surgical options would be a arthroscopic intervention with extensive debridement versus referral for shoulder replacement.  She would like to wait until after the holidays to consider this.  We will see her back in January to go over options again.

## 2019-12-09 ENCOUNTER — Telehealth: Payer: Self-pay | Admitting: Orthopaedic Surgery

## 2019-12-09 MED ORDER — TRAMADOL HCL 50 MG PO TABS: 50.0000 mg | ORAL_TABLET | Freq: Four times a day (QID) | ORAL | 0 refills | Status: DC | PRN

## 2019-12-09 NOTE — Telephone Encounter (Signed)
I sent in some to [facility]

## 2019-12-09 NOTE — Telephone Encounter (Signed)
Patient called. She would like Tramadol called in today incase she needs it while out of town.  Her call back number is [phone]

## 2019-12-09 NOTE — Telephone Encounter (Signed)
I called pt and advised rx has been sent to pharm.  

## 2019-12-28 DIAGNOSIS — H353132 Nonexudative age-related macular degeneration, bilateral, intermediate dry stage: Secondary | ICD-10-CM | POA: Diagnosis not present

## 2019-12-28 DIAGNOSIS — H35453 Secondary pigmentary degeneration, bilateral: Secondary | ICD-10-CM | POA: Diagnosis not present

## 2019-12-28 DIAGNOSIS — H35722 Serous detachment of retinal pigment epithelium, left eye: Secondary | ICD-10-CM | POA: Diagnosis not present

## 2019-12-28 DIAGNOSIS — H35363 Drusen (degenerative) of macula, bilateral: Secondary | ICD-10-CM | POA: Diagnosis not present

## 2019-12-28 DIAGNOSIS — H25811 Combined forms of age-related cataract, right eye: Secondary | ICD-10-CM | POA: Diagnosis not present

## 2019-12-28 DIAGNOSIS — H5315 Visual distortions of shape and size: Secondary | ICD-10-CM | POA: Diagnosis not present

## 2020-02-07 ENCOUNTER — Ambulatory Visit: Payer: Self-pay

## 2020-02-07 ENCOUNTER — Encounter: Payer: Self-pay | Admitting: Orthopaedic Surgery

## 2020-02-07 ENCOUNTER — Ambulatory Visit: Payer: Medicare PPO | Admitting: Orthopaedic Surgery

## 2020-02-07 DIAGNOSIS — G8929 Other chronic pain: Secondary | ICD-10-CM

## 2020-02-07 DIAGNOSIS — M25531 Pain in right wrist: Secondary | ICD-10-CM

## 2020-02-07 DIAGNOSIS — M25512 Pain in left shoulder: Secondary | ICD-10-CM | POA: Diagnosis not present

## 2020-02-07 DIAGNOSIS — M1712 Unilateral primary osteoarthritis, left knee: Secondary | ICD-10-CM | POA: Diagnosis not present

## 2020-02-07 MED ORDER — LIDOCAINE HCL 1 % IJ SOLN
5.0000 mL | INTRAMUSCULAR | Status: AC | PRN
  Administered 2020-02-07: 5 mL

## 2020-02-07 MED ORDER — TRAMADOL HCL 50 MG PO TABS: 50.0000 mg | ORAL_TABLET | Freq: Four times a day (QID) | ORAL | 0 refills | Status: DC | PRN

## 2020-02-07 MED ORDER — METHYLPREDNISOLONE ACETATE 40 MG/ML IJ SUSP
40.0000 mg | INTRAMUSCULAR | Status: AC | PRN
  Administered 2020-02-07: 40 mg via INTRA_ARTICULAR

## 2020-02-07 NOTE — Progress Notes (Signed)
Office Visit Note   Patient: Penny Henry           Date of Birth: 24-Sep-1940           MRN: [id number] Visit Date: 02/07/2020              Requested by: Buckner Malta, MD [address] [place],  [place] [address] PCP: Buckner Malta, MD   Assessment & Plan: Visit Diagnoses:  1. Pain in right wrist   2. Chronic left shoulder pain   3. Unilateral primary osteoarthritis, left knee     Plan: Recommend Voltaren gel 2 g up to 4 times daily to the right wrist.  In regards to the left knee his ASO apply a pullover knee support currently sleeping fine with it is beneficial for her.  She understands to wait at least 6 months between supplemental injections and 3 months between cortisone injections.  Refill her tramadol today.  We will distally observe her left shoulder for now as she is having no significant pain with it.  Follow-Up Instructions: Return if symptoms worsen or fail to improve.   Orders:  Orders Placed This Encounter  Procedures  . Large Joint Inj  . XR Wrist Complete Right   Meds ordered this encounter  Medications  . traMADol (ULTRAM) 50 MG tablet    Sig: Take 1-2 tablets (50-100 mg total) by mouth every 6 (six) hours as needed.    Dispense:  30 tablet    Refill:  0      Procedures: Large Joint Inj: L knee on 02/07/2020 11:20 AM Indications: pain Details: 22 G 1.5 in needle, anterolateral approach  Arthrogram: No  Medications: 40 mg methylPREDNISolone acetate 40 MG/ML; 5 mL lidocaine 1 % Aspirate: 18 mL yellow Outcome: tolerated well, no immediate complications Procedure, treatment alternatives, risks and benefits explained, specific risks discussed. Consent was given by the patient. Immediately prior to procedure a time out was called to verify the correct patient, procedure, equipment, support staff and site/side marked as required. Patient was prepped and draped in the usual sterile fashion.       Clinical Data: No additional  findings.   Subjective: Chief Complaint  Patient presents with  . Left Shoulder - Follow-up    HPI [name] returns today for follow-up of her left shoulder.  She has been full-thickness rotator cuff tear on the left long and supraspinatus tendon.  She also has moderate arthritis in her AC joint and mild arthritis involving the glenohumeral joint.  She states currently she is having a significant pain in the left shoulder.  She states tramadol is helpful with this.  She is asking for refill on tramadol today.  Main complaint today is her left knee and right wrist.  She states that the left knee she has had no new injury to.  She has known arthritis of the left knee.  She last had a cortisone injection in the knee on 11/01/2019 and a supplemental injection on 11/29/2019.  In regards to her right wrist she fell going into the church service around Christmas and injured her right wrist.  She states that pain is unchanged.  She notes some swelling over the ulnar aspect of the right wrist.  Has had no treatment for the wrist pain.  Review of Systems Negative for fevers chills shortness of breath or recent vaccines.  Objective: Vital Signs: There were no vitals taken for this visit.  Physical Exam Constitutional:  Appearance: She is not ill-appearing or diaphoretic.  Pulmonary:     Effort: Pulmonary effort is normal.  Neurological:     Mental Status: She is alert and oriented to person, place, and time.  Psychiatric:        Mood and Affect: Mood normal.     Ortho Exam Left shoulder she has full active forward flexion and full active abduction.  Limited internal rotation secondary to pain. Right wrist she has tenderness over the distal ulna there is some swelling no bruising no skin rashes lacerations or ecchymosis.  She has good range of motion of the wrist with dorsal volar ulnar and radial motion.  Remainder the wrist nontender.  The hand is neurovascularly intact. Left knee: No  abnormal warmth erythema.  Slight effusion.  She has valgus deformity of the knee.  Tenderness along medial joint line.  Passive range of motion reveals good range of motion but patellofemoral crepitus.  Specialty Comments:  No specialty comments available.  Imaging: XR Wrist Complete Right  Result Date: 02/07/2020 3 views right wrist: No acute fractures no acute findings.  Wrist is  well located.    PMFS History: Patient Active Problem List   Diagnosis Date Noted  . Nontraumatic complete tear of left rotator cuff 12/08/2019  . Chronic left shoulder pain 12/08/2019  . Chronic pain of left knee 10/14/2017  . Unilateral primary osteoarthritis, left knee 09/16/2017  . Stasis dermatitis 09/16/2017  . Allergic rhinitis 09/08/2017  . PVC (premature ventricular contraction) 08/03/2014  . SVT (supraventricular tachycardia) ([facility]) 08/03/2014   Past Medical History:  Diagnosis Date  . Acid reflux disease   . Anxiety   . Borderline hypercholesterolemia   . Insomnia   . Irritable bowel syndrome     Family History  Problem Relation Age of Onset  . Arthritis Mother   . Arthritis Father     Past Surgical History:  Procedure Laterality Date  . KNEE SURGERY     Social History   Occupational History  . Not on file  Tobacco Use  . Smoking status: Never Smoker  . Smokeless tobacco: Never Used  Substance and Sexual Activity  . Alcohol use: Not Currently  . Drug use: Not Currently  . Sexual activity: Not on file

## 2020-02-29 DIAGNOSIS — S99922A Unspecified injury of left foot, initial encounter: Secondary | ICD-10-CM | POA: Diagnosis not present

## 2020-02-29 DIAGNOSIS — Z6822 Body mass index (BMI) 22.0-22.9, adult: Secondary | ICD-10-CM | POA: Diagnosis not present

## 2020-02-29 DIAGNOSIS — F4322 Adjustment disorder with anxiety: Secondary | ICD-10-CM | POA: Diagnosis not present

## 2020-03-14 DIAGNOSIS — Z6822 Body mass index (BMI) 22.0-22.9, adult: Secondary | ICD-10-CM | POA: Diagnosis not present

## 2020-03-14 DIAGNOSIS — S99922D Unspecified injury of left foot, subsequent encounter: Secondary | ICD-10-CM | POA: Diagnosis not present

## 2020-03-14 DIAGNOSIS — M19072 Primary osteoarthritis, left ankle and foot: Secondary | ICD-10-CM | POA: Diagnosis not present

## 2020-03-26 DIAGNOSIS — M5416 Radiculopathy, lumbar region: Secondary | ICD-10-CM | POA: Diagnosis not present

## 2020-03-26 DIAGNOSIS — M4716 Other spondylosis with myelopathy, lumbar region: Secondary | ICD-10-CM | POA: Diagnosis not present

## 2020-03-28 ENCOUNTER — Other Ambulatory Visit: Payer: Self-pay | Admitting: Sports Medicine

## 2020-03-28 ENCOUNTER — Ambulatory Visit: Payer: Medicare Other | Admitting: Sports Medicine

## 2020-03-28 DIAGNOSIS — M79672 Pain in left foot: Secondary | ICD-10-CM

## 2020-04-02 DIAGNOSIS — H25811 Combined forms of age-related cataract, right eye: Secondary | ICD-10-CM | POA: Diagnosis not present

## 2020-04-02 DIAGNOSIS — H353111 Nonexudative age-related macular degeneration, right eye, early dry stage: Secondary | ICD-10-CM | POA: Diagnosis not present

## 2020-04-02 DIAGNOSIS — H353122 Nonexudative age-related macular degeneration, left eye, intermediate dry stage: Secondary | ICD-10-CM | POA: Diagnosis not present

## 2020-04-02 DIAGNOSIS — Z961 Presence of intraocular lens: Secondary | ICD-10-CM | POA: Diagnosis not present

## 2020-04-02 DIAGNOSIS — H35722 Serous detachment of retinal pigment epithelium, left eye: Secondary | ICD-10-CM | POA: Diagnosis not present

## 2020-04-02 DIAGNOSIS — H35453 Secondary pigmentary degeneration, bilateral: Secondary | ICD-10-CM | POA: Diagnosis not present

## 2020-04-02 DIAGNOSIS — H35363 Drusen (degenerative) of macula, bilateral: Secondary | ICD-10-CM | POA: Diagnosis not present

## 2020-04-02 DIAGNOSIS — M5416 Radiculopathy, lumbar region: Secondary | ICD-10-CM | POA: Diagnosis not present

## 2020-04-04 DIAGNOSIS — H21231 Degeneration of iris (pigmentary), right eye: Secondary | ICD-10-CM | POA: Diagnosis not present

## 2020-04-04 DIAGNOSIS — H5213 Myopia, bilateral: Secondary | ICD-10-CM | POA: Diagnosis not present

## 2020-04-04 DIAGNOSIS — H2511 Age-related nuclear cataract, right eye: Secondary | ICD-10-CM | POA: Diagnosis not present

## 2020-04-16 DIAGNOSIS — M4716 Other spondylosis with myelopathy, lumbar region: Secondary | ICD-10-CM | POA: Diagnosis not present

## 2020-04-16 DIAGNOSIS — M5416 Radiculopathy, lumbar region: Secondary | ICD-10-CM | POA: Diagnosis not present

## 2020-04-16 HISTORY — PX: CATARACT EXTRACTION W/ INTRAOCULAR LENS IMPLANT: SHX1309

## 2020-04-24 DIAGNOSIS — M5416 Radiculopathy, lumbar region: Secondary | ICD-10-CM | POA: Diagnosis not present

## 2020-04-25 DIAGNOSIS — K648 Other hemorrhoids: Secondary | ICD-10-CM | POA: Diagnosis not present

## 2020-04-25 DIAGNOSIS — K219 Gastro-esophageal reflux disease without esophagitis: Secondary | ICD-10-CM | POA: Diagnosis not present

## 2020-04-26 DIAGNOSIS — H25811 Combined forms of age-related cataract, right eye: Secondary | ICD-10-CM | POA: Diagnosis not present

## 2020-04-26 DIAGNOSIS — H2511 Age-related nuclear cataract, right eye: Secondary | ICD-10-CM | POA: Diagnosis not present

## 2020-05-08 DIAGNOSIS — Z79891 Long term (current) use of opiate analgesic: Secondary | ICD-10-CM | POA: Diagnosis not present

## 2020-05-08 DIAGNOSIS — G894 Chronic pain syndrome: Secondary | ICD-10-CM | POA: Diagnosis not present

## 2020-05-08 DIAGNOSIS — Z79899 Other long term (current) drug therapy: Secondary | ICD-10-CM | POA: Diagnosis not present

## 2020-05-08 DIAGNOSIS — M5416 Radiculopathy, lumbar region: Secondary | ICD-10-CM | POA: Diagnosis not present

## 2020-05-08 DIAGNOSIS — M7062 Trochanteric bursitis, left hip: Secondary | ICD-10-CM | POA: Diagnosis not present

## 2020-05-14 DIAGNOSIS — K219 Gastro-esophageal reflux disease without esophagitis: Secondary | ICD-10-CM | POA: Diagnosis not present

## 2020-05-14 DIAGNOSIS — Z6822 Body mass index (BMI) 22.0-22.9, adult: Secondary | ICD-10-CM | POA: Diagnosis not present

## 2020-05-14 DIAGNOSIS — F4322 Adjustment disorder with anxiety: Secondary | ICD-10-CM | POA: Diagnosis not present

## 2020-05-25 DIAGNOSIS — Z1211 Encounter for screening for malignant neoplasm of colon: Secondary | ICD-10-CM | POA: Diagnosis not present

## 2020-05-25 DIAGNOSIS — K644 Residual hemorrhoidal skin tags: Secondary | ICD-10-CM | POA: Diagnosis not present

## 2020-05-25 DIAGNOSIS — K648 Other hemorrhoids: Secondary | ICD-10-CM | POA: Diagnosis not present

## 2020-05-25 DIAGNOSIS — F32A Depression, unspecified: Secondary | ICD-10-CM | POA: Diagnosis not present

## 2020-05-25 DIAGNOSIS — Z800 Family history of malignant neoplasm of digestive organs: Secondary | ICD-10-CM | POA: Diagnosis not present

## 2020-06-15 ENCOUNTER — Other Ambulatory Visit: Payer: Self-pay | Admitting: Family Medicine

## 2020-06-15 DIAGNOSIS — Z1231 Encounter for screening mammogram for malignant neoplasm of breast: Secondary | ICD-10-CM

## 2020-06-27 DIAGNOSIS — M4716 Other spondylosis with myelopathy, lumbar region: Secondary | ICD-10-CM | POA: Diagnosis not present

## 2020-07-09 ENCOUNTER — Telehealth: Payer: Self-pay

## 2020-07-09 DIAGNOSIS — M5416 Radiculopathy, lumbar region: Secondary | ICD-10-CM | POA: Diagnosis not present

## 2020-07-09 DIAGNOSIS — M7062 Trochanteric bursitis, left hip: Secondary | ICD-10-CM | POA: Diagnosis not present

## 2020-07-09 NOTE — Telephone Encounter (Signed)
Patient called she wants to get pre approval process started for a gel injection patient last received a gel injection 11/29/2019 call [phone]

## 2020-07-09 NOTE — Telephone Encounter (Signed)
Noted  

## 2020-07-12 ENCOUNTER — Telehealth: Payer: Self-pay

## 2020-07-12 NOTE — Telephone Encounter (Signed)
VOB submitted for Monovisc, left knee. Pending BV. 

## 2020-07-23 ENCOUNTER — Telehealth: Payer: Self-pay

## 2020-07-23 NOTE — Telephone Encounter (Signed)
PA submitted online through [employer] for Monovisc, left knee. Pending PA# [id number]

## 2020-07-25 ENCOUNTER — Telehealth: Payer: Self-pay

## 2020-07-25 NOTE — Telephone Encounter (Signed)
Additional information is needed for authorization to be approved through [employer] for Monovisc, left knee injection.   Please provide documentation of:   Efficacy of previous Viscosupplement treatment   The treatment plan/order

## 2020-07-26 NOTE — Telephone Encounter (Signed)
Called pt and scheduled f/u.

## 2020-08-08 ENCOUNTER — Ambulatory Visit: Payer: Medicare PPO | Admitting: Orthopaedic Surgery

## 2020-08-08 ENCOUNTER — Other Ambulatory Visit: Payer: Self-pay

## 2020-08-08 ENCOUNTER — Ambulatory Visit (INDEPENDENT_AMBULATORY_CARE_PROVIDER_SITE_OTHER): Payer: Medicare PPO

## 2020-08-08 ENCOUNTER — Encounter: Payer: Self-pay | Admitting: Orthopaedic Surgery

## 2020-08-08 DIAGNOSIS — M25462 Effusion, left knee: Secondary | ICD-10-CM

## 2020-08-08 DIAGNOSIS — G8929 Other chronic pain: Secondary | ICD-10-CM

## 2020-08-08 DIAGNOSIS — M25562 Pain in left knee: Secondary | ICD-10-CM

## 2020-08-08 DIAGNOSIS — M1712 Unilateral primary osteoarthritis, left knee: Secondary | ICD-10-CM

## 2020-08-08 NOTE — Progress Notes (Signed)
Office Visit Note   Patient: Penny Henry           Date of Birth: 29-Jul-1940           MRN: [id number] Visit Date: 08/08/2020              Requested by: Buckner Malta, MD [address] [place],  [place] [address] PCP: Buckner Malta, MD   Assessment & Plan: Visit Diagnoses:  1. Chronic pain of left knee   2. Effusion, left knee   3. Unilateral primary osteoarthritis, left knee     Plan: I was able to aspirate 25 cc of clear yellow fluid from her left knee consistent with osteoarthritis.  She is the perfect candidate for hyaluronic acid again for her left knee given the success she has had with hyaluronic acid injections in the longevity that these injections have given her knee and how long the pain has subsided with hyaluronic gel injections.  Steroid injections have not been effective for her at all.  Again, she has failed everything other forms of conservative treatment including offloading that knee with a cane in her opposite hand, quad training exercises, oral and topical anti-inflammatories and steroid injections.  This is the next obvious step for her for hyaluronic acid for her left knee to treat the pain from osteoarthritis.  Given that this is helped significantly in the past, this is the next step for her other than knee replacement surgery.  Follow-Up Instructions: No follow-ups on file.   Orders:  Orders Placed This Encounter  Procedures   XR Knee 1-2 Views Left   No orders of the defined types were placed in this encounter.     Procedures: No procedures performed   Clinical Data: No additional findings.   Subjective: Chief Complaint  Patient presents with   Left Knee - Pain  The patient is well-known to me.  She is a 80 year old female with well-documented osteoarthritis of the left knee.  She last had a hyaluronic acid injection 8 months ago in that left knee to treat the pain from osteoarthritis and it helped wonderfully.  She has now  developed knee pain again and is requesting another gel injection.  Since this is been 8 months I think this is reasonable.  Her pain is mainly with weightbearing.  It does also wake her up at night.  She does have a moderate knee joint effusion on that left knee.  We have tried numerous steroid injections in the past and that has not helped.  She is taking over-the-counter anti-inflammatories as well as Tylenol arthritis.  That has helped minimally.  She works on Dance movement psychotherapist exercises and is offload that knee with an assistive device using a cane or opposite hand.  She is also been through quad strengthening exercises and therapy.  At this point I agree with the need for a repeat hyaluronic acid injection because that is lasted for over 6 months and that is been the best treatment for her other than considering knee replacement surgery.  HPI  Review of Systems There is currently listed no headache, chest pain, shortness of breath, fever, chills, nausea, vomiting  Objective: Vital Signs: There were no vitals taken for this visit.  Physical Exam She is alert and oriented x3 and in no acute distress Ortho Exam Examination of her left knee shows a moderate knee joint effusion.  There is lateral joint line tenderness and patellofemoral crepitation.  The knee is ligamentously stable with good  range of motion but a painful arc of motion. Specialty Comments:  No specialty comments available.  Imaging: XR Knee 1-2 Views Left  Result Date: 08/08/2020 2 views of the left knee show tricompartment arthritis mainly involving the lateral compartment with slight valgus malalignment, lateral joint space narrowing and sclerotic changes in the lateral compartment.  There is narrowing of the patellofemoral joint and a degree of para-articular osteophytes.    PMFS History: Patient Active Problem List   Diagnosis Date Noted   Nontraumatic complete tear of left rotator cuff 12/08/2019   Chronic left  shoulder pain 12/08/2019   Chronic pain of left knee 10/14/2017   Unilateral primary osteoarthritis, left knee 09/16/2017   Stasis dermatitis 09/16/2017   Allergic rhinitis 09/08/2017   PVC (premature ventricular contraction) 08/03/2014   SVT (supraventricular tachycardia) (HCC) 08/03/2014   Past Medical History:  Diagnosis Date   Acid reflux disease    Anxiety    Borderline hypercholesterolemia    Insomnia    Irritable bowel syndrome     Family History  Problem Relation Age of Onset   Arthritis Mother    Arthritis Father     Past Surgical History:  Procedure Laterality Date   KNEE SURGERY     Social History   Occupational History   Not on file  Tobacco Use   Smoking status: Never   Smokeless tobacco: Never  Substance and Sexual Activity   Alcohol use: Not Currently   Drug use: Not Currently   Sexual activity: Not on file

## 2020-08-10 ENCOUNTER — Telehealth: Payer: Self-pay

## 2020-08-10 NOTE — Telephone Encounter (Signed)
Resubmitted PA through [facility] with updated office note for Monovisc, left knee. Pending PA# [id number]

## 2020-08-13 ENCOUNTER — Other Ambulatory Visit: Payer: Self-pay

## 2020-08-13 ENCOUNTER — Ambulatory Visit
Admission: RE | Admit: 2020-08-13 | Discharge: 2020-08-13 | Disposition: A | Discharge status: Home / Self Care | Payer: Medicare PPO | Source: Ambulatory Visit | Attending: Family Medicine | Admitting: Family Medicine

## 2020-08-13 DIAGNOSIS — Z1231 Encounter for screening mammogram for malignant neoplasm of breast: Secondary | ICD-10-CM

## 2020-08-15 ENCOUNTER — Other Ambulatory Visit: Payer: Self-pay | Admitting: Family Medicine

## 2020-08-15 DIAGNOSIS — R928 Other abnormal and inconclusive findings on diagnostic imaging of breast: Secondary | ICD-10-CM

## 2020-08-16 ENCOUNTER — Telehealth: Payer: Self-pay

## 2020-08-16 NOTE — Telephone Encounter (Signed)
Approved for Monovisc, left knee. [place] Once OOP has been met, patient will be covered at 100% Co-pay of $40.00 PA Approval# [id number] Valid 08/10/2020- 11/08/2020  Appt. 08/20/2020 with Dr. Ninfa Linden

## 2020-08-17 DIAGNOSIS — M25551 Pain in right hip: Secondary | ICD-10-CM | POA: Diagnosis not present

## 2020-08-17 DIAGNOSIS — I951 Orthostatic hypotension: Secondary | ICD-10-CM | POA: Diagnosis not present

## 2020-08-17 DIAGNOSIS — Z6822 Body mass index (BMI) 22.0-22.9, adult: Secondary | ICD-10-CM | POA: Diagnosis not present

## 2020-08-17 DIAGNOSIS — D259 Leiomyoma of uterus, unspecified: Secondary | ICD-10-CM | POA: Diagnosis not present

## 2020-08-17 DIAGNOSIS — M79651 Pain in right thigh: Secondary | ICD-10-CM | POA: Diagnosis not present

## 2020-08-17 DIAGNOSIS — W19XXXA Unspecified fall, initial encounter: Secondary | ICD-10-CM | POA: Diagnosis not present

## 2020-08-20 ENCOUNTER — Ambulatory Visit: Payer: Medicare PPO | Admitting: Orthopaedic Surgery

## 2020-08-20 ENCOUNTER — Other Ambulatory Visit: Payer: Self-pay

## 2020-08-20 DIAGNOSIS — M1712 Unilateral primary osteoarthritis, left knee: Secondary | ICD-10-CM

## 2020-08-20 DIAGNOSIS — G8929 Other chronic pain: Secondary | ICD-10-CM

## 2020-08-20 DIAGNOSIS — M25562 Pain in left knee: Secondary | ICD-10-CM | POA: Diagnosis not present

## 2020-08-20 DIAGNOSIS — M5416 Radiculopathy, lumbar region: Secondary | ICD-10-CM | POA: Diagnosis not present

## 2020-08-20 MED ORDER — LIDOCAINE HCL 1 % IJ SOLN
3.0000 mL | INTRAMUSCULAR | Status: AC | PRN
  Administered 2020-08-20: 3 mL

## 2020-08-20 MED ORDER — HYDROCODONE-ACETAMINOPHEN 5-325 MG PO TABS: 1.0000 | ORAL_TABLET | Freq: Four times a day (QID) | ORAL | 0 refills | Status: DC | PRN

## 2020-08-20 MED ORDER — HYALURONAN 88 MG/4ML IX SOSY
88.0000 mg | PREFILLED_SYRINGE | INTRA_ARTICULAR | Status: AC | PRN
  Administered 2020-08-20: 88 mg via INTRA_ARTICULAR

## 2020-08-20 NOTE — Progress Notes (Signed)
   Procedure Note  Patient: Penny Henry             Date of Birth: Apr 11, 1940           MRN: [id number]             Visit Date: 08/20/2020  Procedures: Visit Diagnoses:  1. Unilateral primary osteoarthritis, left knee   2. Chronic pain of left knee     Large Joint Inj: L knee on 08/20/2020 9:44 AM Indications: diagnostic evaluation and pain Details: 22 G 1.5 in needle, superolateral approach  Arthrogram: No  Medications: 3 mL lidocaine 1 %; 88 mg Hyaluronan 88 MG/4ML Outcome: tolerated well, no immediate complications Procedure, treatment alternatives, risks and benefits explained, specific risks discussed. Consent was given by the patient. Immediately prior to procedure a time out was called to verify the correct patient, procedure, equipment, support staff and site/side marked as required. Patient was prepped and draped in the usual sterile fashion.   The patient comes in today for scheduled hyaluronic acid injection with Monovisc in the left knee to treat known osteoarthritis of the left knee.  She has tried failed other conservative treatment measures including steroids for that knee.  She has had successful hyaluronic acid injections in the past.  She has been dealing with significant right-sided sciatica after recent fall off a porch.  She is a patient of spine scoliosis specialists.  She has an appointment to see them in September but may need this moved up.  The left knee shows no effusion.  I did place Monovisc in the left knee without difficulty.  She knows that we can always repeat this in 6 months to a year if needed.  I have encouraged her to get an appointment sooner with spinal scoliosis specialist to address her sciatica.  They have MRI of her back in the past and she is a regular patient of theirs.  I will send in some hydrocodone for her pain.  All questions and concerns were answered and addressed.

## 2020-08-21 DIAGNOSIS — M5416 Radiculopathy, lumbar region: Secondary | ICD-10-CM | POA: Diagnosis not present

## 2020-08-22 DIAGNOSIS — D259 Leiomyoma of uterus, unspecified: Secondary | ICD-10-CM | POA: Diagnosis not present

## 2020-08-22 DIAGNOSIS — N858 Other specified noninflammatory disorders of uterus: Secondary | ICD-10-CM | POA: Diagnosis not present

## 2020-08-22 DIAGNOSIS — D251 Intramural leiomyoma of uterus: Secondary | ICD-10-CM | POA: Diagnosis not present

## 2020-08-31 ENCOUNTER — Other Ambulatory Visit: Payer: Self-pay | Admitting: Family Medicine

## 2020-08-31 ENCOUNTER — Ambulatory Visit
Admission: RE | Admit: 2020-08-31 | Discharge: 2020-08-31 | Disposition: A | Discharge status: Home / Self Care | Payer: Medicare PPO | Source: Ambulatory Visit | Attending: Family Medicine | Admitting: Family Medicine

## 2020-08-31 ENCOUNTER — Other Ambulatory Visit: Payer: Self-pay

## 2020-08-31 ENCOUNTER — Ambulatory Visit: Payer: Medicare PPO

## 2020-08-31 ENCOUNTER — Other Ambulatory Visit: Payer: Self-pay | Admitting: Obstetrics and Gynecology

## 2020-08-31 DIAGNOSIS — R928 Other abnormal and inconclusive findings on diagnostic imaging of breast: Secondary | ICD-10-CM

## 2020-08-31 DIAGNOSIS — R922 Inconclusive mammogram: Secondary | ICD-10-CM | POA: Diagnosis not present

## 2020-09-03 ENCOUNTER — Other Ambulatory Visit: Payer: Self-pay | Admitting: Family Medicine

## 2020-09-04 DIAGNOSIS — M5416 Radiculopathy, lumbar region: Secondary | ICD-10-CM | POA: Diagnosis not present

## 2020-09-17 DIAGNOSIS — M545 Low back pain, unspecified: Secondary | ICD-10-CM | POA: Diagnosis not present

## 2020-09-17 DIAGNOSIS — M5416 Radiculopathy, lumbar region: Secondary | ICD-10-CM | POA: Diagnosis not present

## 2020-09-26 DIAGNOSIS — M5416 Radiculopathy, lumbar region: Secondary | ICD-10-CM | POA: Diagnosis not present

## 2020-10-09 ENCOUNTER — Telehealth: Payer: Self-pay | Admitting: Orthopaedic Surgery

## 2020-10-09 NOTE — Telephone Encounter (Signed)
Patient called advised the injection in her left leg did not work. Patient asked what can she do now that will help with the pain? The number to contact patient is [phone]

## 2020-10-10 NOTE — Telephone Encounter (Signed)
I talked to the pt and she stated she doesn't want sx. I told her that she would need to come in and discuss her pain with dr. Magnus Ivan. Pt hasnt had cortisone injection in over a year. I advised he may be able to try this for her if it helped in the past. She stated she would cb to schedule appt.

## 2020-10-10 NOTE — Telephone Encounter (Signed)
Lvm for pt to cb to schedule f/u with dr. Magnus Ivan to discuss sx if that is what she wants to do.

## 2020-10-22 DIAGNOSIS — H353111 Nonexudative age-related macular degeneration, right eye, early dry stage: Secondary | ICD-10-CM | POA: Diagnosis not present

## 2020-10-22 DIAGNOSIS — H35722 Serous detachment of retinal pigment epithelium, left eye: Secondary | ICD-10-CM | POA: Diagnosis not present

## 2020-10-22 DIAGNOSIS — Z961 Presence of intraocular lens: Secondary | ICD-10-CM | POA: Diagnosis not present

## 2020-10-22 DIAGNOSIS — H35453 Secondary pigmentary degeneration, bilateral: Secondary | ICD-10-CM | POA: Diagnosis not present

## 2020-10-22 DIAGNOSIS — H35363 Drusen (degenerative) of macula, bilateral: Secondary | ICD-10-CM | POA: Diagnosis not present

## 2020-10-24 ENCOUNTER — Ambulatory Visit: Payer: Medicare PPO | Admitting: Orthopaedic Surgery

## 2020-10-24 ENCOUNTER — Encounter: Payer: Self-pay | Admitting: Orthopaedic Surgery

## 2020-10-24 DIAGNOSIS — M25562 Pain in left knee: Secondary | ICD-10-CM

## 2020-10-24 DIAGNOSIS — G8929 Other chronic pain: Secondary | ICD-10-CM

## 2020-10-24 DIAGNOSIS — M5416 Radiculopathy, lumbar region: Secondary | ICD-10-CM | POA: Diagnosis not present

## 2020-10-24 MED ORDER — LIDOCAINE HCL 1 % IJ SOLN
3.0000 mL | INTRAMUSCULAR | Status: AC | PRN
  Administered 2020-10-24: 3 mL

## 2020-10-24 MED ORDER — METHYLPREDNISOLONE ACETATE 40 MG/ML IJ SUSP
40.0000 mg | INTRAMUSCULAR | Status: AC | PRN
Start: 2020-10-24 — End: 2020-10-24
  Administered 2020-10-24: 40 mg via INTRA_ARTICULAR

## 2020-10-24 NOTE — Progress Notes (Signed)
Office Visit Note   Patient: Penny Henry           Date of Birth: 09/23/1940           MRN: [id number] Visit Date: 10/24/2020              Requested by: Buckner Malta, MD [address] [place],  [place] [address] PCP: Buckner Malta, MD   Assessment & Plan: Visit Diagnoses:  1. Chronic pain of left knee     Plan: I aspirated about 15 cc of clear yellow fluid from her left knee consistent with osteoarthritis.  I then placed a steroid injection in the knee.  Follow-up can be as needed.  We can always repeat injection in 3 months minimal if needed.  Follow-Up Instructions: Return if symptoms worsen or fail to improve.   Orders:  Orders Placed This Encounter  Procedures   Large Joint Inj    No orders of the defined types were placed in this encounter.     Procedures: Large Joint Inj on 10/24/2020 3:54 PM Indications: diagnostic evaluation and pain Details: 22 G 1.5 in needle, superolateral approach  Arthrogram: No  Medications: 3 mL lidocaine 1 %; 40 mg methylPREDNISolone acetate 40 MG/ML Outcome: tolerated well, no immediate complications Procedure, treatment alternatives, risks and benefits explained, specific risks discussed. Consent was given by the patient. Immediately prior to procedure a time out was called to verify the correct patient, procedure, equipment, support staff and site/side marked as required. Patient was prepped and draped in the usual sterile fashion.      Clinical Data: No additional findings.   Subjective: Chief Complaint  Patient presents with   Left Knee - Pain  The patient comes in today with acute on chronic left knee pain.  She is asking for aspiration and injection.  She has had previous hyaluronic acid injections that are now little longer work.  She has known osteoarthritis of her left knee that is quite significant.  She has failed other treatment measures.  The last steroid shot was in January of this year.  She is not  interested in knee replacement.  She has had no other acute change in her medical status.  HPI  Review of Systems There is currently listed no headache, chest pain, shortness of breath, fever, chills, nausea, vomiting  Objective: Vital Signs: There were no vitals taken for this visit.  Physical Exam She is alert and oriented x3 and in no acute distress Ortho Exam Examination of her left knee shows global tenderness with patellofemoral crepitation with good range of motion.  The knee is loosely stable.  There is a mild effusion. Specialty Comments:  No specialty comments available.  Imaging: No results found.   PMFS History: Patient Active Problem List   Diagnosis Date Noted   Nontraumatic complete tear of left rotator cuff 12/08/2019   Chronic left shoulder pain 12/08/2019   Chronic pain of left knee 10/14/2017   Unilateral primary osteoarthritis, left knee 09/16/2017   Stasis dermatitis 09/16/2017   Allergic rhinitis 09/08/2017   PVC (premature ventricular contraction) 08/03/2014   SVT (supraventricular tachycardia) (HCC) 08/03/2014   Past Medical History:  Diagnosis Date   Acid reflux disease    Anxiety    Borderline hypercholesterolemia    Insomnia    Irritable bowel syndrome     Family History  Problem Relation Age of Onset   Arthritis Mother    Arthritis Father     Past Surgical History:  Procedure Laterality Date   KNEE SURGERY     Social History   Occupational History   Not on file  Tobacco Use   Smoking status: Never   Smokeless tobacco: Never  Substance and Sexual Activity   Alcohol use: Not Currently   Drug use: Not Currently   Sexual activity: Not on file

## 2020-10-30 DIAGNOSIS — H353221 Exudative age-related macular degeneration, left eye, with active choroidal neovascularization: Secondary | ICD-10-CM | POA: Diagnosis not present

## 2020-11-26 ENCOUNTER — Other Ambulatory Visit: Payer: Self-pay | Admitting: Orthopaedic Surgery

## 2020-11-26 DIAGNOSIS — Z96612 Presence of left artificial shoulder joint: Secondary | ICD-10-CM

## 2020-12-06 DIAGNOSIS — H353221 Exudative age-related macular degeneration, left eye, with active choroidal neovascularization: Secondary | ICD-10-CM | POA: Diagnosis not present

## 2021-01-09 DIAGNOSIS — H353221 Exudative age-related macular degeneration, left eye, with active choroidal neovascularization: Secondary | ICD-10-CM | POA: Diagnosis not present

## 2021-01-15 ENCOUNTER — Ambulatory Visit (INDEPENDENT_AMBULATORY_CARE_PROVIDER_SITE_OTHER): Payer: Medicare PPO

## 2021-01-15 ENCOUNTER — Telehealth: Payer: Self-pay

## 2021-01-15 ENCOUNTER — Ambulatory Visit: Payer: Self-pay

## 2021-01-15 ENCOUNTER — Ambulatory Visit: Payer: Medicare PPO | Admitting: Physician Assistant

## 2021-01-15 ENCOUNTER — Other Ambulatory Visit: Payer: Self-pay

## 2021-01-15 ENCOUNTER — Encounter: Payer: Self-pay | Admitting: Physician Assistant

## 2021-01-15 DIAGNOSIS — G8929 Other chronic pain: Secondary | ICD-10-CM

## 2021-01-15 DIAGNOSIS — M25562 Pain in left knee: Secondary | ICD-10-CM | POA: Diagnosis not present

## 2021-01-15 DIAGNOSIS — M25561 Pain in right knee: Secondary | ICD-10-CM

## 2021-01-15 DIAGNOSIS — M1712 Unilateral primary osteoarthritis, left knee: Secondary | ICD-10-CM | POA: Diagnosis not present

## 2021-01-15 MED ORDER — METHYLPREDNISOLONE ACETATE 40 MG/ML IJ SUSP
40.0000 mg | INTRAMUSCULAR | Status: AC | PRN
  Administered 2021-01-15: 12:00:00 40 mg via INTRA_ARTICULAR

## 2021-01-15 MED ORDER — LIDOCAINE HCL 1 % IJ SOLN
3.0000 mL | INTRAMUSCULAR | Status: AC | PRN
Start: 2021-01-15 — End: 2021-01-15
  Administered 2021-01-15: 12:00:00 3 mL

## 2021-01-15 NOTE — Progress Notes (Addendum)
Office Visit Note   Patient: Penny Henry           Date of Birth: 10/03/1940           MRN: [id number] Visit Date: 01/15/2021              Requested by: Buckner Malta, MD [address] [place],  [place] [address] PCP: Buckner Malta, MD   Assessment & Plan: Visit Diagnoses:  1. Chronic pain of left knee   2. Chronic pain of right knee   3. Primary osteoarthritis of left knee     Plan: She will work on quad strengthening for both knees.  She tolerated the aspiration injection of the right knee today well.  She understands to wait least 3 months between cortisone injections.  We will try to gain approval for left knee supplemental injection.  Questions were encouraged and answered  Follow-Up Instructions: Return for Supplemental injection.   Orders:  Orders Placed This Encounter  Procedures   Large Joint Inj: L knee   XR Knee 1-2 Views Right   XR Knee 1-2 Views Left   Meds ordered this encounter  Medications   lidocaine (XYLOCAINE) 1 % (with pres) injection 3 mL   methylPREDNISolone acetate (DEPO-MEDROL) injection 40 mg      Procedures: Large Joint Inj: L knee on 01/15/2021 11:45 AM Indications: pain Details: 22 G 1.5 in needle, superolateral approach  Arthrogram: No  Medications: 3 mL lidocaine 1 %; 40 mg methylPREDNISolone acetate 40 MG/ML Aspirate: 21 mL yellow Outcome: tolerated well, no immediate complications Procedure, treatment alternatives, risks and benefits explained, specific risks discussed. Consent was given by the patient. Immediately prior to procedure a time out was called to verify the correct patient, procedure, equipment, support staff and site/side marked as required. Patient was prepped and draped in the usual sterile fashion.      Clinical Data: No additional findings.   Subjective: Chief Complaint  Patient presents with   Right Knee - Pain   Left Knee - Pain    HPI Penny Henry returns today for bilateral knee pain.  She  last was seen on 10/24/2020 for left knee pain/arthritis and was given cortisone injection.  She states her right knee started bothering her little bit but does not bother her as bad as the left knee.  She is wanting to know what else can be done for the left knee.  States the left knee cortisone injection gave her relief for about 2 weeks.  She has had no acute injury to either knee.  She does feel like the left knee is bowing more on her.  No fevers chills.  She is nondiabetic. Review of Systems See HPI.  Objective: Vital Signs: There were no vitals taken for this visit.  Physical Exam General well-developed well-nourished female no acute distress mood and affect appropriate. Ortho Exam Left knee slight valgus deformity.  Good range of motion the no instability valgus varus stressing.  Is slight effusion.  Right knee no abnormal warmth erythema or effusion.  Good range of motion of the knee. Specialty Comments:  No specialty comments available.  Imaging: No results found.   PMFS History: Patient Active Problem List   Diagnosis Date Noted   Nontraumatic complete tear of left rotator cuff 12/08/2019   Chronic left shoulder pain 12/08/2019   Chronic pain of left knee 10/14/2017   Unilateral primary osteoarthritis, left knee 09/16/2017   Stasis dermatitis 09/16/2017   Allergic rhinitis 09/08/2017  PVC (premature ventricular contraction) 08/03/2014   SVT (supraventricular tachycardia) (HCC) 08/03/2014   Past Medical History:  Diagnosis Date   Acid reflux disease    Anxiety    Borderline hypercholesterolemia    Insomnia    Irritable bowel syndrome     Family History  Problem Relation Age of Onset   Arthritis Mother    Arthritis Father     Past Surgical History:  Procedure Laterality Date   KNEE SURGERY     Social History   Occupational History   Not on file  Tobacco Use   Smoking status: Never   Smokeless tobacco: Never  Substance and Sexual Activity   Alcohol use:  Not Currently   Drug use: Not Currently   Sexual activity: Not on file

## 2021-01-15 NOTE — Telephone Encounter (Signed)
Please get auth for left knee gel injection-gil pt 

## 2021-01-15 NOTE — Telephone Encounter (Signed)
Noted.  Will submit after 01/29/2021. 

## 2021-02-05 ENCOUNTER — Telehealth: Payer: Self-pay | Admitting: Orthopaedic Surgery

## 2021-02-05 ENCOUNTER — Other Ambulatory Visit: Payer: Self-pay | Admitting: Orthopaedic Surgery

## 2021-02-05 MED ORDER — CELECOXIB 200 MG PO CAPS: 200.0000 mg | ORAL_CAPSULE | Freq: Two times a day (BID) | ORAL | 2 refills | Status: DC | PRN

## 2021-02-05 NOTE — Telephone Encounter (Signed)
Called and advised pt.

## 2021-02-05 NOTE — Telephone Encounter (Signed)
Pt called stating she was talking to her brother about her pain levels and he mentioned that he takes celebrex and it helps him a lot. Pt was wondering if celebrex might be a good option for her and would like a CB to discuss further.   [phone]

## 2021-02-25 DIAGNOSIS — H353221 Exudative age-related macular degeneration, left eye, with active choroidal neovascularization: Secondary | ICD-10-CM | POA: Diagnosis not present

## 2021-02-25 DIAGNOSIS — G894 Chronic pain syndrome: Secondary | ICD-10-CM | POA: Diagnosis not present

## 2021-02-25 DIAGNOSIS — Z79891 Long term (current) use of opiate analgesic: Secondary | ICD-10-CM | POA: Diagnosis not present

## 2021-02-25 DIAGNOSIS — M5416 Radiculopathy, lumbar region: Secondary | ICD-10-CM | POA: Diagnosis not present

## 2021-02-25 DIAGNOSIS — Z79890 Hormone replacement therapy (postmenopausal): Secondary | ICD-10-CM | POA: Diagnosis not present

## 2021-02-25 NOTE — Telephone Encounter (Signed)
VOB has been submitted for Monovisc, left knee. ?BV pending ? ?

## 2021-02-25 NOTE — Telephone Encounter (Signed)
Pt called about an update on her injection.   Cb [phone]

## 2021-02-25 NOTE — Telephone Encounter (Signed)
Talked with patient concerning gel injection.  

## 2021-02-27 ENCOUNTER — Telehealth: Payer: Self-pay

## 2021-02-27 NOTE — Telephone Encounter (Signed)
Approved for Monovisc, left knee. [place] Once the OOP has been met, patient is covered at 100% Co-pay of $40.00 No PA required  Appt. 02/28/2021 with Artis Delay

## 2021-02-28 ENCOUNTER — Encounter: Payer: Self-pay | Admitting: Physician Assistant

## 2021-02-28 ENCOUNTER — Other Ambulatory Visit: Payer: Self-pay

## 2021-02-28 ENCOUNTER — Ambulatory Visit: Payer: Medicare PPO | Admitting: Physician Assistant

## 2021-02-28 DIAGNOSIS — M1712 Unilateral primary osteoarthritis, left knee: Secondary | ICD-10-CM | POA: Diagnosis not present

## 2021-02-28 MED ORDER — METHYLPREDNISOLONE ACETATE 40 MG/ML IJ SUSP
40.0000 mg | INTRAMUSCULAR | Status: AC | PRN
  Administered 2021-02-28: 40 mg via INTRA_ARTICULAR

## 2021-02-28 MED ORDER — LIDOCAINE HCL 1 % IJ SOLN
3.0000 mL | INTRAMUSCULAR | Status: AC | PRN
  Administered 2021-02-28: 3 mL

## 2021-02-28 NOTE — Progress Notes (Signed)
° °  Procedure Note  Patient: Penny Henry             Date of Birth: 23-Feb-1940           MRN: [id number]             Visit Date: 02/28/2021  HPI: Penny Henry returns today for scheduled left knee Monovisc injection.  She is trying conservative treatment including cortisone injections and NSAIDs and still has left knee pain.  She has known left knee osteoarthritis.  Radiographs of her left knee showed mild to moderate patellofemoral arthritic changes and tricompartment  arthritic changes with a slight valgus deformity.  She has had a new injury in the knee.  No scheduled knee surgery in the next 6 months.  Physical exam: Left knee good range of motion.  Slight effusion no abnormal warmth erythema.  Procedures: Visit Diagnoses:  1. Primary osteoarthritis of left knee     Large Joint Inj: L knee on 02/28/2021 2:29 PM Indications: pain Details: 22 G 1.5 in needle, superolateral approach  Arthrogram: No  Medications: 3 mL lidocaine 1 %; 40 mg methylPREDNISolone acetate 40 MG/ML Aspirate: 11 mL yellow Outcome: tolerated well, no immediate complications Procedure, treatment alternatives, risks and benefits explained, specific risks discussed. Consent was given by the patient. Immediately prior to procedure a time out was called to verify the correct patient, procedure, equipment, support staff and site/side marked as required. Patient was prepped and draped in the usual sterile fashion.    Plan: She understands lately 6 months between supplemental injections.  Questions were encouraged and answered.  Follow-up as as needed.  Ace bandage was applied she will take this off before going to bed tonight.

## 2021-04-01 DIAGNOSIS — H353221 Exudative age-related macular degeneration, left eye, with active choroidal neovascularization: Secondary | ICD-10-CM | POA: Diagnosis not present

## 2021-05-13 DIAGNOSIS — H353221 Exudative age-related macular degeneration, left eye, with active choroidal neovascularization: Secondary | ICD-10-CM | POA: Diagnosis not present

## 2021-05-16 DIAGNOSIS — J309 Allergic rhinitis, unspecified: Secondary | ICD-10-CM | POA: Diagnosis not present

## 2021-05-16 DIAGNOSIS — K219 Gastro-esophageal reflux disease without esophagitis: Secondary | ICD-10-CM | POA: Diagnosis not present

## 2021-05-16 DIAGNOSIS — J040 Acute laryngitis: Secondary | ICD-10-CM | POA: Diagnosis not present

## 2021-05-16 DIAGNOSIS — Z6823 Body mass index (BMI) 23.0-23.9, adult: Secondary | ICD-10-CM | POA: Diagnosis not present

## 2021-05-24 ENCOUNTER — Other Ambulatory Visit: Payer: Self-pay | Admitting: Family Medicine

## 2021-05-24 DIAGNOSIS — Z1231 Encounter for screening mammogram for malignant neoplasm of breast: Secondary | ICD-10-CM

## 2021-05-30 DIAGNOSIS — Z6822 Body mass index (BMI) 22.0-22.9, adult: Secondary | ICD-10-CM | POA: Diagnosis not present

## 2021-05-30 DIAGNOSIS — J309 Allergic rhinitis, unspecified: Secondary | ICD-10-CM | POA: Diagnosis not present

## 2021-05-30 DIAGNOSIS — F4321 Adjustment disorder with depressed mood: Secondary | ICD-10-CM | POA: Diagnosis not present

## 2021-05-30 DIAGNOSIS — Z9181 History of falling: Secondary | ICD-10-CM | POA: Diagnosis not present

## 2021-05-30 DIAGNOSIS — R490 Dysphonia: Secondary | ICD-10-CM | POA: Diagnosis not present

## 2021-05-30 DIAGNOSIS — K219 Gastro-esophageal reflux disease without esophagitis: Secondary | ICD-10-CM | POA: Diagnosis not present

## 2021-06-13 DIAGNOSIS — K580 Irritable bowel syndrome with diarrhea: Secondary | ICD-10-CM | POA: Diagnosis not present

## 2021-06-13 DIAGNOSIS — M4716 Other spondylosis with myelopathy, lumbar region: Secondary | ICD-10-CM | POA: Diagnosis not present

## 2021-06-13 DIAGNOSIS — Z6822 Body mass index (BMI) 22.0-22.9, adult: Secondary | ICD-10-CM | POA: Diagnosis not present

## 2021-06-13 DIAGNOSIS — Z1331 Encounter for screening for depression: Secondary | ICD-10-CM | POA: Diagnosis not present

## 2021-06-13 DIAGNOSIS — K219 Gastro-esophageal reflux disease without esophagitis: Secondary | ICD-10-CM | POA: Diagnosis not present

## 2021-06-13 DIAGNOSIS — Z79899 Other long term (current) drug therapy: Secondary | ICD-10-CM | POA: Diagnosis not present

## 2021-06-13 DIAGNOSIS — Z13220 Encounter for screening for lipoid disorders: Secondary | ICD-10-CM | POA: Diagnosis not present

## 2021-06-13 DIAGNOSIS — F4321 Adjustment disorder with depressed mood: Secondary | ICD-10-CM | POA: Diagnosis not present

## 2021-06-13 DIAGNOSIS — Z0000 Encounter for general adult medical examination without abnormal findings: Secondary | ICD-10-CM | POA: Diagnosis not present

## 2021-06-17 DIAGNOSIS — H353221 Exudative age-related macular degeneration, left eye, with active choroidal neovascularization: Secondary | ICD-10-CM | POA: Diagnosis not present

## 2021-06-18 DIAGNOSIS — M5416 Radiculopathy, lumbar region: Secondary | ICD-10-CM | POA: Diagnosis not present

## 2021-06-18 DIAGNOSIS — Z01419 Encounter for gynecological examination (general) (routine) without abnormal findings: Secondary | ICD-10-CM | POA: Diagnosis not present

## 2021-06-25 DIAGNOSIS — Z961 Presence of intraocular lens: Secondary | ICD-10-CM | POA: Diagnosis not present

## 2021-06-25 DIAGNOSIS — H21231 Degeneration of iris (pigmentary), right eye: Secondary | ICD-10-CM | POA: Diagnosis not present

## 2021-06-28 ENCOUNTER — Other Ambulatory Visit: Payer: Self-pay | Admitting: Orthopaedic Surgery

## 2021-07-19 DIAGNOSIS — H353221 Exudative age-related macular degeneration, left eye, with active choroidal neovascularization: Secondary | ICD-10-CM | POA: Diagnosis not present

## 2021-08-01 ENCOUNTER — Other Ambulatory Visit: Payer: Self-pay | Admitting: Family Medicine

## 2021-08-01 DIAGNOSIS — M810 Age-related osteoporosis without current pathological fracture: Secondary | ICD-10-CM

## 2021-08-05 ENCOUNTER — Telehealth: Payer: Self-pay | Admitting: Physician Assistant

## 2021-08-05 NOTE — Telephone Encounter (Signed)
Next available gel injection would need to be after August 28, 2021. Patient is aware

## 2021-08-05 NOTE — Telephone Encounter (Signed)
Talked with patient. Appt.scheduled for gel injection.

## 2021-08-05 NOTE — Telephone Encounter (Signed)
Pt called requesting a call back to set an appt for another gel injection. Last injection 02/28/21. Please call pt at [phone]

## 2021-08-09 ENCOUNTER — Telehealth: Payer: Self-pay

## 2021-08-09 NOTE — Telephone Encounter (Signed)
VOB submitted for Monovisc, left knee. °BV pending. °

## 2021-08-14 ENCOUNTER — Ambulatory Visit: Payer: Medicare PPO

## 2021-08-14 ENCOUNTER — Ambulatory Visit
Admission: RE | Admit: 2021-08-14 | Discharge: 2021-08-14 | Disposition: A | Discharge status: Home / Self Care | Payer: Medicare PPO | Source: Ambulatory Visit | Attending: Family Medicine | Admitting: Family Medicine

## 2021-08-14 DIAGNOSIS — Z1231 Encounter for screening mammogram for malignant neoplasm of breast: Secondary | ICD-10-CM

## 2021-08-16 ENCOUNTER — Other Ambulatory Visit: Payer: Self-pay

## 2021-08-16 DIAGNOSIS — M1712 Unilateral primary osteoarthritis, left knee: Secondary | ICD-10-CM

## 2021-08-23 DIAGNOSIS — H353221 Exudative age-related macular degeneration, left eye, with active choroidal neovascularization: Secondary | ICD-10-CM | POA: Diagnosis not present

## 2021-08-29 ENCOUNTER — Ambulatory Visit: Payer: Medicare PPO | Admitting: Orthopaedic Surgery

## 2021-08-29 ENCOUNTER — Encounter: Payer: Self-pay | Admitting: Orthopaedic Surgery

## 2021-08-29 DIAGNOSIS — M5416 Radiculopathy, lumbar region: Secondary | ICD-10-CM | POA: Diagnosis not present

## 2021-08-29 DIAGNOSIS — M1712 Unilateral primary osteoarthritis, left knee: Secondary | ICD-10-CM | POA: Diagnosis not present

## 2021-08-29 MED ORDER — HYALURONAN 88 MG/4ML IX SOSY
88.0000 mg | PREFILLED_SYRINGE | INTRA_ARTICULAR | Status: AC | PRN
  Administered 2021-08-29: 88 mg via INTRA_ARTICULAR

## 2021-08-29 NOTE — Progress Notes (Signed)
   Procedure Note  Patient: PENI RUPARD             Date of Birth: Jul 03, 1940           MRN: [id number]             Visit Date: 08/29/2021  Procedures: Visit Diagnoses:  1. Primary osteoarthritis of left knee     Large Joint Inj: L knee on 08/29/2021 2:13 PM Indications: diagnostic evaluation and pain Details: 22 G 1.5 in needle, superolateral approach  Arthrogram: No  Medications: 88 mg Hyaluronan 88 MG/4ML Outcome: tolerated well, no immediate complications Procedure, treatment alternatives, risks and benefits explained, specific risks discussed. Consent was given by the patient. Immediately prior to procedure a time out was called to verify the correct patient, procedure, equipment, support staff and site/side marked as required. Patient was prepped and draped in the usual sterile fashion.    The patient is here today for scheduled hyaluronic acid injection with Monovisc to treat the pain from osteoarthritis.  She is 81 years old and active.  She has well-documented osteoarthritis of her left knee and this is helped her in the past.  Examination of her left knee today shows a moderate effusion.  I was able to aspirate about 25 cc of fluid which was clear yellow from her left knee.  I then placed Monovisc in her left knee without difficulty.  She knows she can have this again if needed in 6 months.  We can always see her in 3 months for repeat aspiration and steroid injection if needed.  All questions and concerns were answered and addressed.  DDU#[id number]

## 2021-09-13 DIAGNOSIS — Z6823 Body mass index (BMI) 23.0-23.9, adult: Secondary | ICD-10-CM | POA: Diagnosis not present

## 2021-09-13 DIAGNOSIS — F4321 Adjustment disorder with depressed mood: Secondary | ICD-10-CM | POA: Diagnosis not present

## 2021-09-18 ENCOUNTER — Ambulatory Visit
Admission: RE | Admit: 2021-09-18 | Discharge: 2021-09-18 | Disposition: A | Discharge status: Home / Self Care | Payer: Medicare PPO | Source: Ambulatory Visit | Attending: Family Medicine | Admitting: Family Medicine

## 2021-09-18 DIAGNOSIS — M8589 Other specified disorders of bone density and structure, multiple sites: Secondary | ICD-10-CM | POA: Diagnosis not present

## 2021-09-18 DIAGNOSIS — M810 Age-related osteoporosis without current pathological fracture: Secondary | ICD-10-CM

## 2021-09-18 DIAGNOSIS — Z780 Asymptomatic menopausal state: Secondary | ICD-10-CM | POA: Diagnosis not present

## 2021-09-25 DIAGNOSIS — H353221 Exudative age-related macular degeneration, left eye, with active choroidal neovascularization: Secondary | ICD-10-CM | POA: Diagnosis not present

## 2021-10-14 DIAGNOSIS — M1712 Unilateral primary osteoarthritis, left knee: Secondary | ICD-10-CM | POA: Diagnosis not present

## 2021-10-14 DIAGNOSIS — Z6822 Body mass index (BMI) 22.0-22.9, adult: Secondary | ICD-10-CM | POA: Diagnosis not present

## 2021-10-14 DIAGNOSIS — F325 Major depressive disorder, single episode, in full remission: Secondary | ICD-10-CM | POA: Diagnosis not present

## 2021-10-28 DIAGNOSIS — H353221 Exudative age-related macular degeneration, left eye, with active choroidal neovascularization: Secondary | ICD-10-CM | POA: Diagnosis not present

## 2021-11-27 ENCOUNTER — Encounter (HOSPITAL_BASED_OUTPATIENT_CLINIC_OR_DEPARTMENT_OTHER): Payer: Self-pay | Admitting: Emergency Medicine

## 2021-11-27 ENCOUNTER — Encounter (HOSPITAL_COMMUNITY): Payer: Self-pay

## 2021-11-27 ENCOUNTER — Emergency Department (HOSPITAL_BASED_OUTPATIENT_CLINIC_OR_DEPARTMENT_OTHER): Payer: Medicare PPO

## 2021-11-27 ENCOUNTER — Inpatient Hospital Stay (HOSPITAL_BASED_OUTPATIENT_CLINIC_OR_DEPARTMENT_OTHER)
Admission: EM | Admit: 2021-11-27 | Discharge: 2021-12-02 | DRG: 522 | Disposition: A | Discharge status: Home Health | Payer: Medicare PPO | Attending: Internal Medicine | Admitting: Internal Medicine

## 2021-11-27 DIAGNOSIS — Z471 Aftercare following joint replacement surgery: Secondary | ICD-10-CM | POA: Diagnosis not present

## 2021-11-27 DIAGNOSIS — M1990 Unspecified osteoarthritis, unspecified site: Secondary | ICD-10-CM | POA: Diagnosis present

## 2021-11-27 DIAGNOSIS — K219 Gastro-esophageal reflux disease without esophagitis: Secondary | ICD-10-CM | POA: Diagnosis present

## 2021-11-27 DIAGNOSIS — I951 Orthostatic hypotension: Secondary | ICD-10-CM | POA: Diagnosis present

## 2021-11-27 DIAGNOSIS — S72002A Fracture of unspecified part of neck of left femur, initial encounter for closed fracture: Secondary | ICD-10-CM | POA: Diagnosis present

## 2021-11-27 DIAGNOSIS — M80052A Age-related osteoporosis with current pathological fracture, left femur, initial encounter for fracture: Secondary | ICD-10-CM | POA: Diagnosis present

## 2021-11-27 DIAGNOSIS — W19XXXA Unspecified fall, initial encounter: Secondary | ICD-10-CM

## 2021-11-27 DIAGNOSIS — I10 Essential (primary) hypertension: Secondary | ICD-10-CM | POA: Diagnosis not present

## 2021-11-27 DIAGNOSIS — Y92009 Unspecified place in unspecified non-institutional (private) residence as the place of occurrence of the external cause: Secondary | ICD-10-CM

## 2021-11-27 DIAGNOSIS — K589 Irritable bowel syndrome without diarrhea: Secondary | ICD-10-CM | POA: Diagnosis present

## 2021-11-27 DIAGNOSIS — W010XXA Fall on same level from slipping, tripping and stumbling without subsequent striking against object, initial encounter: Secondary | ICD-10-CM | POA: Diagnosis present

## 2021-11-27 DIAGNOSIS — F411 Generalized anxiety disorder: Secondary | ICD-10-CM | POA: Diagnosis present

## 2021-11-27 DIAGNOSIS — Z881 Allergy status to other antibiotic agents status: Secondary | ICD-10-CM

## 2021-11-27 DIAGNOSIS — G4700 Insomnia, unspecified: Secondary | ICD-10-CM | POA: Diagnosis present

## 2021-11-27 DIAGNOSIS — E7800 Pure hypercholesterolemia, unspecified: Secondary | ICD-10-CM | POA: Diagnosis present

## 2021-11-27 DIAGNOSIS — F419 Anxiety disorder, unspecified: Secondary | ICD-10-CM | POA: Diagnosis present

## 2021-11-27 DIAGNOSIS — K5900 Constipation, unspecified: Secondary | ICD-10-CM | POA: Diagnosis not present

## 2021-11-27 DIAGNOSIS — Z9109 Other allergy status, other than to drugs and biological substances: Secondary | ICD-10-CM | POA: Diagnosis not present

## 2021-11-27 DIAGNOSIS — D72829 Elevated white blood cell count, unspecified: Secondary | ICD-10-CM | POA: Diagnosis present

## 2021-11-27 DIAGNOSIS — Z79899 Other long term (current) drug therapy: Secondary | ICD-10-CM | POA: Diagnosis not present

## 2021-11-27 DIAGNOSIS — Z96642 Presence of left artificial hip joint: Secondary | ICD-10-CM | POA: Diagnosis not present

## 2021-11-27 DIAGNOSIS — S72042A Displaced fracture of base of neck of left femur, initial encounter for closed fracture: Secondary | ICD-10-CM | POA: Diagnosis not present

## 2021-11-27 DIAGNOSIS — R9431 Abnormal electrocardiogram [ECG] [EKG]: Secondary | ICD-10-CM | POA: Diagnosis not present

## 2021-11-27 DIAGNOSIS — Z8261 Family history of arthritis: Secondary | ICD-10-CM

## 2021-11-27 DIAGNOSIS — Z043 Encounter for examination and observation following other accident: Secondary | ICD-10-CM | POA: Diagnosis not present

## 2021-11-27 DIAGNOSIS — D62 Acute posthemorrhagic anemia: Secondary | ICD-10-CM | POA: Diagnosis not present

## 2021-11-27 LAB — CBC WITH DIFFERENTIAL/PLATELET
Abs Immature Granulocytes: 0.07 10*3/uL (ref 0.00–0.07)
Basophils Absolute: 0.1 10*3/uL (ref 0.0–0.1)
Basophils Relative: 1 %
Eosinophils Absolute: 0.1 10*3/uL (ref 0.0–0.5)
Eosinophils Relative: 1 %
HCT: 40.8 % (ref 36.0–46.0)
Hemoglobin: 13.2 g/dL (ref 12.0–15.0)
Immature Granulocytes: 1 %
Lymphocytes Relative: 11 %
Lymphs Abs: 1.2 10*3/uL (ref 0.7–4.0)
MCH: 29.8 pg (ref 26.0–34.0)
MCHC: 32.4 g/dL (ref 30.0–36.0)
MCV: 92.1 fL (ref 80.0–100.0)
Monocytes Absolute: 0.8 10*3/uL (ref 0.1–1.0)
Monocytes Relative: 7 %
Neutro Abs: 8.8 10*3/uL — ABNORMAL HIGH (ref 1.7–7.7)
Neutrophils Relative %: 79 %
Platelets: 170 10*3/uL (ref 150–400)
RBC: 4.43 MIL/uL (ref 3.87–5.11)
RDW: 12.2 % (ref 11.5–15.5)
WBC: 11.1 10*3/uL — ABNORMAL HIGH (ref 4.0–10.5)
nRBC: 0 % (ref 0.0–0.2)

## 2021-11-27 LAB — BASIC METABOLIC PANEL
Anion gap: 8 (ref 5–15)
BUN: 23 mg/dL (ref 8–23)
CO2: 27 mmol/L (ref 22–32)
Calcium: 9.2 mg/dL (ref 8.9–10.3)
Chloride: 103 mmol/L (ref 98–111)
Creatinine, Ser: 0.92 mg/dL (ref 0.44–1.00)
GFR, Estimated: >60 mL/min (ref >60)
Glucose, Bld: 115 mg/dL — ABNORMAL HIGH (ref 70–99)
Potassium: 4.2 mmol/L (ref 3.5–5.1)
Sodium: 138 mmol/L (ref 135–145)

## 2021-11-27 LAB — PROTIME-INR
INR: 1 (ref 0.8–1.2)
Prothrombin Time: 13.4 seconds (ref 11.4–15.2)

## 2021-11-27 MED ORDER — SODIUM CHLORIDE 0.9 % IV SOLN: Freq: Once | INTRAVENOUS | Status: AC

## 2021-11-27 MED ORDER — HYDROMORPHONE HCL 1 MG/ML IJ SOLN
0.5000 mg | INTRAMUSCULAR | Status: DC | PRN
  Administered 2021-11-27: 0.5 mg via INTRAVENOUS
  Filled 2021-11-27: qty 1

## 2021-11-27 MED ORDER — ONDANSETRON HCL 4 MG/2ML IJ SOLN
4.0000 mg | Freq: Once | INTRAMUSCULAR | Status: AC
  Administered 2021-11-27: 4 mg via INTRAVENOUS
  Filled 2021-11-27: qty 2

## 2021-11-27 MED ORDER — HYDROMORPHONE HCL 1 MG/ML IJ SOLN
1.0000 mg | INTRAMUSCULAR | Status: DC | PRN
  Administered 2021-11-27 – 2021-11-28 (×2): 1 mg via INTRAVENOUS
  Filled 2021-11-27 (×2): qty 1

## 2021-11-27 NOTE — ED Triage Notes (Signed)
Pt states she fell while getting leaves up  Pt states she fell off the carport into the gravel  Pt states she injured her left leg  Pt states it hurts to move  Pt states she is unable to bear weight on it

## 2021-11-27 NOTE — ED Provider Notes (Signed)
[place] EMERGENCY DEPARTMENT Provider Note   CSN: [id number] Arrival date & time: 11/27/21  1907     History  Chief Complaint  Patient presents with   Penny Henry is a 81 y.o. female.  HPI    Patient was sweeping leaves out of her garage.  She tripped over a small rec knee wall and landed on her left hip.  Reports after she landed the hip was severely painful and she could not move it.  She reports she scraped her cheek a little bit on the ground but did not have loss of consciousness and did not hit her head.  She denies she is having chest pain or difficulty breathing.  Patient reports that she has history of osteoarthritis in her knee and sees Dr. Ninfa Linden. Patient sees Dr. Jean Rosenthal for orthopedics [facility] health Ortho care), patient is otherwise healthy and does not take any anticoagulants.  She last ate around lunchtime today. Home Medications Prior to Admission medications   Medication Sig Start Date End Date Taking? Authorizing Provider  buPROPion ER (WELLBUTRIN SR) 100 MG 12 hr tablet Take 100 mg by mouth daily.   Yes [provider]  aspirin EC 81 MG tablet Take 81 mg by mouth daily.     [provider]  Azelastine HCl 0.15 % SOLN  07/29/14   [provider]  Bioflavonoid Products (ESTER C PO) Take 1 tablet by mouth daily.     [provider]  celecoxib (CELEBREX) 200 MG capsule Take 1 capsule (200 mg total) by mouth 2 (two) times daily as needed. 06/28/21   Mcarthur Rossetti, MD  cetirizine (ZYRTEC) 10 MG tablet Take 10 mg by mouth daily as needed.     [provider]  Cholecalciferol (VITAMIN D-3) 5000 units TABS Take 1 tablet by mouth daily.    [provider]  dicyclomine (BENTYL) 20 MG tablet Take 20 mg by mouth 4 (four) times daily as needed.     [provider]  escitalopram (LEXAPRO) 10 MG tablet escitalopram 10 mg tablet    [provider]  estradiol  (ESTRACE) 1 MG tablet estradiol 1 mg tablet  Take 1 tablet(s) every day by oral route.    [provider]  FIBER ADULT GUMMIES PO Take 1 tablet by mouth daily.    [provider]  fluocinonide gel (LIDEX) 0.05 % fluocinonide 0.05 % topical gel    [provider]  Glucosamine Sulfate 1000 MG CAPS Take by mouth.    [provider]  HYDROcodone-acetaminophen (NORCO/VICODIN) 5-325 MG tablet Take 1 tablet by mouth every 6 (six) hours as needed for moderate pain. 08/20/20   Mcarthur Rossetti, MD  LORazepam (ATIVAN) 1 MG tablet Take 1.5 mg by mouth daily. 08/20/17   [provider]  Magnesium 500 MG CAPS Take 1 capsule by mouth daily.     [provider]  methylPREDNISolone (MEDROL) 4 MG tablet Medrol dose pack. Take as instructed 07/28/18   Mcarthur Rossetti, MD  Multiple Vitamin (MULTI-VITAMINS PO) Take by mouth.    [provider]  multivitamin-lutein (OCUVITE-LUTEIN) CAPS capsule Take 1 capsule by mouth daily.    [provider]  Omega-3 Fatty Acids (FISH OIL) 1200 MG CPDR Take 1 tablet by mouth 2 (two) times daily.     [provider]  progesterone (PROMETRIUM) 100 MG capsule Take 100 mg by mouth daily.    [provider]  traMADol Veatrice Bourbon)  50 MG tablet Take 1-2 tablets (50-100 mg total) by mouth every 6 (six) hours as needed. 02/07/20   Kirtland Bouchard, PA-C  traZODone (DESYREL) 50 MG tablet Take 50 mg by mouth daily.    [provider]      Allergies    Doxycycline, Doxycycline hyclate, and Povidone iodine    Review of Systems   Review of Systems  Physical Exam Updated Vital Signs BP 136/81 (BP Location: Left Arm)   Pulse 84   Temp 98.4 F (36.9 C) (Oral)   Resp 15   Ht 5\' 7"  (1.702 m)   Wt 65.8 kg   SpO2 99%   BMI 22.71 kg/m  Physical Exam Constitutional:      Comments: Alert, nontoxic, normal mental status.  No respiratory distress.  HENT:     Head:     Comments: Very  superficial abrasion to the left cheek with no hematoma or bleeding.  Normocephalic atraumatic.    Mouth/Throat:     Pharynx: Oropharynx is clear.  Eyes:     Extraocular Movements: Extraocular movements intact.     Pupils: Pupils are equal, round, and reactive to light.  Neck:     Comments: No midline c spine tenderness. Cardiovascular:     Rate and Rhythm: Normal rate and regular rhythm.  Pulmonary:     Effort: Pulmonary effort is normal.     Breath sounds: Normal breath sounds.  Chest:     Chest wall: No tenderness.  Abdominal:     General: There is no distension.     Palpations: Abdomen is soft.     Tenderness: There is no abdominal tenderness. There is no guarding.  Musculoskeletal:     Comments: External rotation left lower extremities.  Shortening.  Dorsalis pedal pulses 2+ and symmetric  Neurological:     Comments: Alert and oriented x3.  Normal mental status.  No focal neurologic deficits.     ED Results / Procedures / Treatments   Labs (all labs ordered are listed, but only abnormal results are displayed) Labs Reviewed  BASIC METABOLIC PANEL - Abnormal; Notable for the following components:      Result Value   Glucose, Bld 115 (*)    All other components within normal limits  CBC WITH DIFFERENTIAL/PLATELET - Abnormal; Notable for the following components:   WBC 11.1 (*)    Neutro Abs 8.8 (*)    All other components within normal limits  PROTIME-INR    EKG EKG Interpretation  Date/Time:  Wednesday November 27 2021 21:42:45 EDT Ventricular Rate:  98 PR Interval:  186 QRS Duration: 100 QT Interval:  382 QTC Calculation: 488 R Axis:   32 Text Interpretation: Sinus rhythm no sig change from previous Confirmed by 05-29-1983 [id number]) on 11/28/2021 12:01:57 AM  Radiology DG Chest Port 1 View  Result Date: 11/27/2021 CLINICAL DATA:  fall hip fracture EXAM: PORTABLE CHEST 1 VIEW COMPARISON:  None Available. FINDINGS: The heart and mediastinal contours are  within normal limits. No focal consolidation. No pulmonary edema. No pleural effusion. No pneumothorax. No acute osseous abnormality. IMPRESSION: No active disease. Electronically Signed   By: 13/01/2021 M.D.   On: 11/27/2021 21:33   DG FEMUR MIN 2 VIEWS LEFT  Result Date: 11/27/2021 CLINICAL DATA:  13/01/2021 Pain [id number] EXAM: LEFT FEMUR 2 VIEWS COMPARISON:  Ultrasound pelvis 08/22/2020 FINDINGS: Acute superiorly displaced left femoral neck fracture. Distally no acute displaced fracture of the femur. Partially visualized left knee grossly unremarkable. No left  hip dislocation. Soft tissues are unremarkable. Coarsely calcified lesion overlying the pelvis is nonspecific but could consistent with a known degenerative uterine fibroid. IMPRESSION: Acute superiorly displaced left femoral neck fracture. Electronically Signed   By: Tish Frederickson M.D.   On: 11/27/2021 20:58    Procedures Procedures    Medications Ordered in ED Medications  HYDROmorphone (DILAUDID) injection 1 mg (1 mg Intravenous Given 11/27/21 2219)  ondansetron (ZOFRAN) injection 4 mg (4 mg Intravenous Given 11/27/21 2131)  0.9 %  sodium chloride infusion ( Intravenous New Bag/Given 11/27/21 2136)    ED Course/ Medical Decision Making/ A&P                           Medical Decision Making Amount and/or Complexity of Data Reviewed Labs: ordered. Radiology: ordered.  Risk Prescription drug management. Decision regarding hospitalization.   Consult: Reviewed with [facility] care physician Dr.Gawne.  He will notify Dr. Magnus Ivan tomorrow of admission.  Patient to be n.p.o. after midnight and admitted to medicine service at [facility]. Consult: Reviewed Dr. Margo Aye [facility] hospitalist for admission  Patient had a mechanical fall.  Injury isolated to left hip.  Baseline patient is active and independent.  Severe pain and deformity of the left hip.  Remainder of physical exam without significant trauma.  Left hip and femur films  independently visualized and reviewed by myself positive for left femoral neck fracture.  Radiology review also interpreted.          Final Clinical Impression(s) / ED Diagnoses Final diagnoses:  Closed fracture of left hip, initial encounter [facility])    Rx / DC Orders ED Discharge Orders     None         Arby Barrette, MD 12/20/21 0745

## 2021-11-27 NOTE — ED Notes (Signed)
ED TO INPATIENT HANDOFF REPORT  ED Nurse Name and Phone #: Dominga Ferry NR Paramedic  S Name/Age/Gender Penny Henry 81 y.o. female Room/Bed: MH10/MH10  Code Status   Code Status: Not on file  Home/SNF/Other Home Patient oriented to: self, place, time, and situation Is this baseline? Yes   Triage Complete: Triage complete  Chief Complaint Fracture of femoral neck, left (HCC) [S72.002A]  Triage Note Pt states she fell while getting leaves up  Pt states she fell off the carport into the gravel  Pt states she injured her left leg  Pt states it hurts to move  Pt states she is unable to bear weight on it     Allergies Allergies  Allergen Reactions   Doxycycline    Doxycycline Hyclate Hives    Other reaction(s): Other (See Comments) Unknown   Povidone Iodine Hives    Level of Care/Admitting Diagnosis ED Disposition     ED Disposition  Admit   Condition  --   Comment  Hospital Area: [facility] [[id number]]  Level of Care: Telemetry Surgical [105]  May admit patient to [facility] or [facility] if equivalent level of care is available:: No  Interfacility transfer: Yes  Covid Evaluation: Asymptomatic - no recent exposure (last 10 days) testing not required  Diagnosis: Fracture of femoral neck, left [facility]) [[id number]]  Admitting Physician: Darlin Drop [[id number]]  Attending Physician: Darlin Drop [[id number]]  Certification:: I certify this patient will need inpatient services for at least 2 midnights  Estimated Length of Stay: 2          B Medical/Surgery History Past Medical History:  Diagnosis Date   Acid reflux disease    Anxiety    Borderline hypercholesterolemia    Insomnia    Irritable bowel syndrome    Past Surgical History:  Procedure Laterality Date   KNEE SURGERY       A IV Location/Drains/Wounds Patient Lines/Drains/Airways Status     Active Line/Drains/Airways     Name Placement date Placement time Site Days    Peripheral IV 11/27/21 20 G Right Antecubital 11/27/21  2129  Antecubital  less than 1            Intake/Output Last 24 hours No intake or output data in the 24 hours ending 11/27/21 2329  Labs/Imaging Results for orders placed or performed during the hospital encounter of 11/27/21 (from the past 48 hour(s))  Basic metabolic panel     Status: Abnormal   Collection Time: 11/27/21  9:17 PM  Result Value Ref Range   Sodium 138 135 - 145 mmol/L   Potassium 4.2 3.5 - 5.1 mmol/L   Chloride 103 98 - 111 mmol/L   CO2 27 22 - 32 mmol/L   Glucose, Bld 115 (H) 70 - 99 mg/dL    Comment: Glucose reference range applies only to samples taken after fasting for at least 8 hours.   BUN 23 8 - 23 mg/dL   Creatinine, Ser 8.78 0.44 - 1.00 mg/dL   Calcium 9.2 8.9 - 67.6 mg/dL   GFR, Estimated >72 >09 mL/min    Comment: (NOTE) Calculated using the CKD-EPI Creatinine Equation (2021)    Anion gap 8 5 - 15    Comment: Performed at [facility], [address] [place], [place] [address]  CBC with Differential     Status: Abnormal   Collection Time: 11/27/21  9:17 PM  Result Value Ref Range   WBC 11.1 (  H) 4.0 - 10.5 K/uL   RBC 4.43 3.87 - 5.11 MIL/uL   Hemoglobin 13.2 12.0 - 15.0 g/dL   HCT 40.8 36.0 - 46.0 %   MCV 92.1 80.0 - 100.0 fL   MCH 29.8 26.0 - 34.0 pg   MCHC 32.4 30.0 - 36.0 g/dL   RDW 12.2 11.5 - 15.5 %   Platelets 170 150 - 400 K/uL   nRBC 0.0 0.0 - 0.2 %   Neutrophils Relative % 79 %   Neutro Abs 8.8 (H) 1.7 - 7.7 K/uL   Lymphocytes Relative 11 %   Lymphs Abs 1.2 0.7 - 4.0 K/uL   Monocytes Relative 7 %   Monocytes Absolute 0.8 0.1 - 1.0 K/uL   Eosinophils Relative 1 %   Eosinophils Absolute 0.1 0.0 - 0.5 K/uL   Basophils Relative 1 %   Basophils Absolute 0.1 0.0 - 0.1 K/uL   Immature Granulocytes 1 %   Abs Immature Granulocytes 0.07 0.00 - 0.07 K/uL    Comment: Performed at [facility], [address] [place], [place] [address]  Protime-INR      Status: None   Collection Time: 11/27/21  9:17 PM  Result Value Ref Range   Prothrombin Time 13.4 11.4 - 15.2 seconds   INR 1.0 0.8 - 1.2    Comment: (NOTE) INR goal varies based on device and disease states. Performed at [facility], [address] [place], [place] [address]    DG Chest [place] 1 View  Result Date: 11/27/2021 CLINICAL DATA:  fall hip fracture EXAM: PORTABLE CHEST 1 VIEW COMPARISON:  None Available. FINDINGS: The heart and mediastinal contours are within normal limits. No focal consolidation. No pulmonary edema. No pleural effusion. No pneumothorax. No acute osseous abnormality. IMPRESSION: No active disease. Electronically Signed   By: Iven Finn M.D.   On: 11/27/2021 21:33   DG FEMUR MIN 2 VIEWS LEFT  Result Date: 11/27/2021 CLINICAL DATA:  [id number] Pain [id number] EXAM: LEFT FEMUR 2 VIEWS COMPARISON:  Ultrasound pelvis 08/22/2020 FINDINGS: Acute superiorly displaced left femoral neck fracture. Distally no acute displaced fracture of the femur. Partially visualized left knee grossly unremarkable. No left hip dislocation. Soft tissues are unremarkable. Coarsely calcified lesion overlying the pelvis is nonspecific but could consistent with a known degenerative uterine fibroid. IMPRESSION: Acute superiorly displaced left femoral neck fracture. Electronically Signed   By: Iven Finn M.D.   On: 11/27/2021 20:58    Pending Labs Unresulted Labs (From admission, onward)    None       Vitals/Pain Today's Vitals   11/27/21 2200 11/27/21 2220 11/27/21 2230 11/27/21 2322  BP: (!) 140/84  (!) 143/88 136/81  Pulse: 97  93 84  Resp: 16  15 15   Temp:    98.4 F (36.9 C)  TempSrc:    Oral  SpO2: 91%  100% 99%  Weight:      Height:      PainSc: 7  7  0-No pain     Isolation Precautions No active isolations  Medications Medications  HYDROmorphone (DILAUDID) injection 1 mg (1 mg Intravenous Given 11/27/21 2219)  ondansetron (ZOFRAN) injection 4 mg (4 mg  Intravenous Given 11/27/21 2131)  0.9 %  sodium chloride infusion ( Intravenous New Bag/Given 11/27/21 2136)    Mobility walks Low fall risk   Focused Assessments Ortho    R Recommendations: See Admitting Provider Note  Report given to:   Additional Notes: n/a

## 2021-11-27 NOTE — ED Notes (Signed)
Pt had a fall PTA fell off carport into gravel injuring left leg.  Pt describes dull ache at upper thigh/groin. Pt unable to bear weight on leg and is in severe pain pivoting from [facility] to stretcher.   Pt's left leg is turning outward to the left, no obvious deformity

## 2021-11-27 NOTE — Plan of Care (Signed)
Orthopedic Plan of Care Note  Patient had a ground level that resulted in a left femoral neck fracture. NVI. She has been seeing Dr. Ninfa Linden in the office. She presented [facility] tonight.   Plan: Transfer to [facility] to medicine for hip fracture Will need operative intervention when medically optimized, tentatively planning for 11/02 Hold anticoag and NPO at midnight Non-weight bearing left lower extremity Will see her in AM and discuss with Dr. Ninfa Linden If any questions, please page me  Callie Fielding, MD Orthopedic Surgeon [facility]

## 2021-11-28 ENCOUNTER — Inpatient Hospital Stay (HOSPITAL_COMMUNITY): Payer: Medicare PPO

## 2021-11-28 ENCOUNTER — Encounter (HOSPITAL_COMMUNITY): Payer: Self-pay | Admitting: Internal Medicine

## 2021-11-28 ENCOUNTER — Other Ambulatory Visit: Payer: Self-pay

## 2021-11-28 ENCOUNTER — Inpatient Hospital Stay (HOSPITAL_COMMUNITY): Payer: Medicare PPO | Admitting: Anesthesiology

## 2021-11-28 ENCOUNTER — Encounter (HOSPITAL_COMMUNITY)
Admission: EM | Disposition: A | Discharge status: Home Health | Payer: Self-pay | Source: Home / Self Care | Attending: Internal Medicine

## 2021-11-28 DIAGNOSIS — F411 Generalized anxiety disorder: Secondary | ICD-10-CM | POA: Diagnosis present

## 2021-11-28 DIAGNOSIS — Y92009 Unspecified place in unspecified non-institutional (private) residence as the place of occurrence of the external cause: Secondary | ICD-10-CM | POA: Diagnosis not present

## 2021-11-28 DIAGNOSIS — S72002A Fracture of unspecified part of neck of left femur, initial encounter for closed fracture: Secondary | ICD-10-CM

## 2021-11-28 DIAGNOSIS — M1990 Unspecified osteoarthritis, unspecified site: Secondary | ICD-10-CM | POA: Diagnosis present

## 2021-11-28 DIAGNOSIS — I951 Orthostatic hypotension: Secondary | ICD-10-CM | POA: Diagnosis present

## 2021-11-28 DIAGNOSIS — S72042A Displaced fracture of base of neck of left femur, initial encounter for closed fracture: Secondary | ICD-10-CM | POA: Diagnosis not present

## 2021-11-28 DIAGNOSIS — Z881 Allergy status to other antibiotic agents status: Secondary | ICD-10-CM | POA: Diagnosis not present

## 2021-11-28 DIAGNOSIS — F419 Anxiety disorder, unspecified: Secondary | ICD-10-CM | POA: Diagnosis present

## 2021-11-28 DIAGNOSIS — K219 Gastro-esophageal reflux disease without esophagitis: Secondary | ICD-10-CM | POA: Diagnosis present

## 2021-11-28 DIAGNOSIS — Z9109 Other allergy status, other than to drugs and biological substances: Secondary | ICD-10-CM | POA: Diagnosis not present

## 2021-11-28 DIAGNOSIS — E7800 Pure hypercholesterolemia, unspecified: Secondary | ICD-10-CM | POA: Diagnosis present

## 2021-11-28 DIAGNOSIS — D72829 Elevated white blood cell count, unspecified: Secondary | ICD-10-CM | POA: Diagnosis present

## 2021-11-28 DIAGNOSIS — D62 Acute posthemorrhagic anemia: Secondary | ICD-10-CM | POA: Diagnosis not present

## 2021-11-28 DIAGNOSIS — W19XXXA Unspecified fall, initial encounter: Secondary | ICD-10-CM | POA: Diagnosis not present

## 2021-11-28 DIAGNOSIS — G4700 Insomnia, unspecified: Secondary | ICD-10-CM | POA: Diagnosis present

## 2021-11-28 DIAGNOSIS — Z79899 Other long term (current) drug therapy: Secondary | ICD-10-CM | POA: Diagnosis not present

## 2021-11-28 DIAGNOSIS — W010XXA Fall on same level from slipping, tripping and stumbling without subsequent striking against object, initial encounter: Secondary | ICD-10-CM | POA: Diagnosis present

## 2021-11-28 DIAGNOSIS — K589 Irritable bowel syndrome without diarrhea: Secondary | ICD-10-CM | POA: Diagnosis present

## 2021-11-28 DIAGNOSIS — K5900 Constipation, unspecified: Secondary | ICD-10-CM | POA: Diagnosis not present

## 2021-11-28 DIAGNOSIS — M80052A Age-related osteoporosis with current pathological fracture, left femur, initial encounter for fracture: Secondary | ICD-10-CM | POA: Diagnosis present

## 2021-11-28 DIAGNOSIS — Z8261 Family history of arthritis: Secondary | ICD-10-CM | POA: Diagnosis not present

## 2021-11-28 HISTORY — PX: TOTAL HIP ARTHROPLASTY: SHX124

## 2021-11-28 LAB — COMPREHENSIVE METABOLIC PANEL
ALT: 23 U/L (ref 0–44)
AST: 18 U/L (ref 15–41)
Albumin: 3.4 g/dL — ABNORMAL LOW (ref 3.5–5.0)
Alkaline Phosphatase: 64 U/L (ref 38–126)
Anion gap: 5 (ref 5–15)
BUN: 20 mg/dL (ref 8–23)
CO2: 30 mmol/L (ref 22–32)
Calcium: 9 mg/dL (ref 8.9–10.3)
Chloride: 103 mmol/L (ref 98–111)
Creatinine, Ser: 0.92 mg/dL (ref 0.44–1.00)
GFR, Estimated: >60 mL/min (ref >60)
Glucose, Bld: 139 mg/dL — ABNORMAL HIGH (ref 70–99)
Potassium: 4.6 mmol/L (ref 3.5–5.1)
Sodium: 138 mmol/L (ref 135–145)
Total Bilirubin: 0.7 mg/dL (ref 0.3–1.2)
Total Protein: 5.8 g/dL — ABNORMAL LOW (ref 6.5–8.1)

## 2021-11-28 LAB — TYPE AND SCREEN
ABO/RH(D): O POS
Antibody Screen: NEGATIVE

## 2021-11-28 LAB — CBC WITH DIFFERENTIAL/PLATELET
Abs Immature Granulocytes: 0.03 10*3/uL (ref 0.00–0.07)
Basophils Absolute: 0 10*3/uL (ref 0.0–0.1)
Basophils Relative: 1 %
Eosinophils Absolute: 0 10*3/uL (ref 0.0–0.5)
Eosinophils Relative: 0 %
HCT: 36 % (ref 36.0–46.0)
Hemoglobin: 11.9 g/dL — ABNORMAL LOW (ref 12.0–15.0)
Immature Granulocytes: 0 %
Lymphocytes Relative: 13 %
Lymphs Abs: 1 10*3/uL (ref 0.7–4.0)
MCH: 30.8 pg (ref 26.0–34.0)
MCHC: 33.1 g/dL (ref 30.0–36.0)
MCV: 93.3 fL (ref 80.0–100.0)
Monocytes Absolute: 0.6 10*3/uL (ref 0.1–1.0)
Monocytes Relative: 9 %
Neutro Abs: 5.7 10*3/uL (ref 1.7–7.7)
Neutrophils Relative %: 77 %
Platelets: 152 10*3/uL (ref 150–400)
RBC: 3.86 MIL/uL — ABNORMAL LOW (ref 3.87–5.11)
RDW: 12 % (ref 11.5–15.5)
WBC: 7.4 10*3/uL (ref 4.0–10.5)
nRBC: 0 % (ref 0.0–0.2)

## 2021-11-28 LAB — ABO/RH: ABO/RH(D): O POS

## 2021-11-28 LAB — MAGNESIUM: Magnesium: 2.1 mg/dL (ref 1.7–2.4)

## 2021-11-28 LAB — VITAMIN D 25 HYDROXY (VIT D DEFICIENCY, FRACTURES): Vit D, 25-Hydroxy: 90.27 ng/mL (ref 30–100)

## 2021-11-28 LAB — SURGICAL PCR SCREEN
MRSA, PCR: NEGATIVE
Staphylococcus aureus: NEGATIVE

## 2021-11-28 SURGERY — ARTHROPLASTY, HIP, TOTAL, ANTERIOR APPROACH
Anesthesia: General | Site: Hip | Laterality: Left

## 2021-11-28 MED ORDER — CHLORHEXIDINE GLUCONATE 0.12 % MT SOLN
OROMUCOSAL | Status: AC
  Filled 2021-11-28: qty 15

## 2021-11-28 MED ORDER — ACETAMINOPHEN 325 MG PO TABS
650.0000 mg | ORAL_TABLET | Freq: Four times a day (QID) | ORAL | Status: DC | PRN
  Administered 2021-11-29 – 2021-12-02 (×6): 650 mg via ORAL
  Filled 2021-11-28 (×6): qty 2

## 2021-11-28 MED ORDER — LORAZEPAM 2 MG/ML IJ SOLN: 0.5000 mg | Freq: Two times a day (BID) | INTRAMUSCULAR | Status: DC | PRN

## 2021-11-28 MED ORDER — POLYETHYLENE GLYCOL 3350 17 G PO PACK: 17.0000 g | PACK | Freq: Every day | ORAL | Status: DC | PRN

## 2021-11-28 MED ORDER — ACETAMINOPHEN 650 MG RE SUPP: 650.0000 mg | Freq: Four times a day (QID) | RECTAL | Status: DC | PRN

## 2021-11-28 MED ORDER — ROCURONIUM BROMIDE 10 MG/ML (PF) SYRINGE
PREFILLED_SYRINGE | INTRAVENOUS | Status: DC | PRN
  Administered 2021-11-28: 50 mg via INTRAVENOUS
  Administered 2021-11-28: 20 mg via INTRAVENOUS

## 2021-11-28 MED ORDER — TRANEXAMIC ACID-NACL 1000-0.7 MG/100ML-% IV SOLN
INTRAVENOUS | Status: AC
  Filled 2021-11-28: qty 100

## 2021-11-28 MED ORDER — SODIUM CHLORIDE 0.9 % IV SOLN: INTRAVENOUS | Status: DC

## 2021-11-28 MED ORDER — STERILE WATER FOR IRRIGATION IR SOLN
Status: DC | PRN
  Administered 2021-11-28 (×2): 1000 mL

## 2021-11-28 MED ORDER — LABETALOL HCL 5 MG/ML IV SOLN
INTRAVENOUS | Status: DC | PRN
  Administered 2021-11-28: 5 mg via INTRAVENOUS

## 2021-11-28 MED ORDER — DIPHENHYDRAMINE HCL 12.5 MG/5ML PO ELIX: 12.5000 mg | ORAL_SOLUTION | ORAL | Status: DC | PRN

## 2021-11-28 MED ORDER — HYDROMORPHONE HCL 1 MG/ML IJ SOLN
0.5000 mg | INTRAMUSCULAR | Status: DC | PRN
  Administered 2021-11-28: 1 mg via INTRAVENOUS
  Filled 2021-11-28: qty 1

## 2021-11-28 MED ORDER — ALUM & MAG HYDROXIDE-SIMETH 200-200-20 MG/5ML PO SUSP: 30.0000 mL | ORAL | Status: DC | PRN

## 2021-11-28 MED ORDER — LACTATED RINGERS IV SOLN: INTRAVENOUS | Status: DC

## 2021-11-28 MED ORDER — FENTANYL CITRATE (PF) 250 MCG/5ML IJ SOLN
INTRAMUSCULAR | Status: DC | PRN
  Administered 2021-11-28 (×5): 50 ug via INTRAVENOUS

## 2021-11-28 MED ORDER — SUGAMMADEX SODIUM 200 MG/2ML IV SOLN
INTRAVENOUS | Status: DC | PRN
  Administered 2021-11-28: 200 mg via INTRAVENOUS

## 2021-11-28 MED ORDER — PHENOL 1.4 % MT LIQD: 1.0000 | OROMUCOSAL | Status: DC | PRN

## 2021-11-28 MED ORDER — METOCLOPRAMIDE HCL 5 MG/ML IJ SOLN: 5.0000 mg | Freq: Three times a day (TID) | INTRAMUSCULAR | Status: DC | PRN

## 2021-11-28 MED ORDER — ALBUMIN HUMAN 5 % IV SOLN: INTRAVENOUS | Status: DC | PRN

## 2021-11-28 MED ORDER — FENTANYL CITRATE (PF) 250 MCG/5ML IJ SOLN
INTRAMUSCULAR | Status: AC
  Filled 2021-11-28: qty 5

## 2021-11-28 MED ORDER — ACETAMINOPHEN 325 MG PO TABS: 325.0000 mg | ORAL_TABLET | Freq: Four times a day (QID) | ORAL | Status: DC | PRN

## 2021-11-28 MED ORDER — PANTOPRAZOLE SODIUM 40 MG PO TBEC
40.0000 mg | DELAYED_RELEASE_TABLET | Freq: Every day | ORAL | Status: DC
  Administered 2021-11-28 – 2021-11-29 (×2): 40 mg via ORAL
  Filled 2021-11-28 (×2): qty 1

## 2021-11-28 MED ORDER — FENTANYL CITRATE (PF) 100 MCG/2ML IJ SOLN: 25.0000 ug | INTRAMUSCULAR | Status: DC | PRN

## 2021-11-28 MED ORDER — ONDANSETRON HCL 4 MG/2ML IJ SOLN: 4.0000 mg | Freq: Four times a day (QID) | INTRAMUSCULAR | Status: DC | PRN

## 2021-11-28 MED ORDER — CEFAZOLIN SODIUM-DEXTROSE 2-4 GM/100ML-% IV SOLN
INTRAVENOUS | Status: AC
  Filled 2021-11-28: qty 100

## 2021-11-28 MED ORDER — ONDANSETRON HCL 4 MG PO TABS: 4.0000 mg | ORAL_TABLET | Freq: Four times a day (QID) | ORAL | Status: DC | PRN

## 2021-11-28 MED ORDER — ORAL CARE MOUTH RINSE: 15.0000 mL | Freq: Once | OROMUCOSAL | Status: DC

## 2021-11-28 MED ORDER — ONDANSETRON HCL 4 MG/2ML IJ SOLN
INTRAMUSCULAR | Status: AC
  Filled 2021-11-28: qty 2

## 2021-11-28 MED ORDER — CEFAZOLIN SODIUM-DEXTROSE 1-4 GM/50ML-% IV SOLN
1.0000 g | Freq: Four times a day (QID) | INTRAVENOUS | Status: AC
  Administered 2021-11-28 (×2): 1 g via INTRAVENOUS
  Filled 2021-11-28 (×2): qty 50

## 2021-11-28 MED ORDER — PHENYLEPHRINE 80 MCG/ML (10ML) SYRINGE FOR IV PUSH (FOR BLOOD PRESSURE SUPPORT)
PREFILLED_SYRINGE | INTRAVENOUS | Status: DC | PRN
  Administered 2021-11-28: 160 ug via INTRAVENOUS
  Administered 2021-11-28: 80 ug via INTRAVENOUS
  Administered 2021-11-28 (×2): 160 ug via INTRAVENOUS
  Administered 2021-11-28: 80 ug via INTRAVENOUS
  Administered 2021-11-28 (×2): 160 ug via INTRAVENOUS

## 2021-11-28 MED ORDER — CEFAZOLIN SODIUM-DEXTROSE 2-3 GM-%(50ML) IV SOLR
INTRAVENOUS | Status: DC | PRN
  Administered 2021-11-28: 2 g via INTRAVENOUS

## 2021-11-28 MED ORDER — DEXAMETHASONE SODIUM PHOSPHATE 10 MG/ML IJ SOLN
INTRAMUSCULAR | Status: AC
  Filled 2021-11-28: qty 1

## 2021-11-28 MED ORDER — MENTHOL 3 MG MT LOZG: 1.0000 | LOZENGE | OROMUCOSAL | Status: DC | PRN

## 2021-11-28 MED ORDER — OXYCODONE HCL 5 MG PO TABS
10.0000 mg | ORAL_TABLET | ORAL | Status: DC | PRN
  Filled 2021-11-28: qty 2

## 2021-11-28 MED ORDER — DOCUSATE SODIUM 100 MG PO CAPS
100.0000 mg | ORAL_CAPSULE | Freq: Two times a day (BID) | ORAL | Status: DC
  Administered 2021-11-28 – 2021-12-02 (×9): 100 mg via ORAL
  Filled 2021-11-28 (×9): qty 1

## 2021-11-28 MED ORDER — ROCURONIUM BROMIDE 10 MG/ML (PF) SYRINGE
PREFILLED_SYRINGE | INTRAVENOUS | Status: AC
  Filled 2021-11-28: qty 10

## 2021-11-28 MED ORDER — CHLORHEXIDINE GLUCONATE 0.12 % MT SOLN: 15.0000 mL | Freq: Once | OROMUCOSAL | Status: DC

## 2021-11-28 MED ORDER — PHENYLEPHRINE 80 MCG/ML (10ML) SYRINGE FOR IV PUSH (FOR BLOOD PRESSURE SUPPORT)
PREFILLED_SYRINGE | INTRAVENOUS | Status: AC
  Filled 2021-11-28: qty 10

## 2021-11-28 MED ORDER — TRANEXAMIC ACID-NACL 1000-0.7 MG/100ML-% IV SOLN
INTRAVENOUS | Status: DC | PRN
  Administered 2021-11-28: 1000 mg via INTRAVENOUS

## 2021-11-28 MED ORDER — METOCLOPRAMIDE HCL 5 MG PO TABS: 5.0000 mg | ORAL_TABLET | Freq: Three times a day (TID) | ORAL | Status: DC | PRN

## 2021-11-28 MED ORDER — LIDOCAINE 2% (20 MG/ML) 5 ML SYRINGE
INTRAMUSCULAR | Status: AC
  Filled 2021-11-28: qty 5

## 2021-11-28 MED ORDER — OXYCODONE HCL 5 MG PO TABS
5.0000 mg | ORAL_TABLET | ORAL | Status: DC | PRN
  Administered 2021-11-28: 10 mg via ORAL
  Administered 2021-11-29: 5 mg via ORAL
  Administered 2021-11-29: 10 mg via ORAL
  Administered 2021-11-29 – 2021-12-01 (×4): 5 mg via ORAL
  Filled 2021-11-28 (×3): qty 1
  Filled 2021-11-28: qty 2
  Filled 2021-11-28: qty 1
  Filled 2021-11-28: qty 2
  Filled 2021-11-28: qty 1

## 2021-11-28 MED ORDER — LIDOCAINE 2% (20 MG/ML) 5 ML SYRINGE
INTRAMUSCULAR | Status: DC | PRN
  Administered 2021-11-28: 60 mg via INTRAVENOUS

## 2021-11-28 MED ORDER — DEXAMETHASONE SODIUM PHOSPHATE 10 MG/ML IJ SOLN
INTRAMUSCULAR | Status: DC | PRN
  Administered 2021-11-28: 10 mg via INTRAVENOUS

## 2021-11-28 MED ORDER — METHOCARBAMOL 500 MG PO TABS
500.0000 mg | ORAL_TABLET | Freq: Four times a day (QID) | ORAL | Status: DC | PRN
  Administered 2021-11-28 – 2021-11-30 (×5): 500 mg via ORAL
  Filled 2021-11-28 (×6): qty 1

## 2021-11-28 MED ORDER — 0.9 % SODIUM CHLORIDE (POUR BTL) OPTIME
TOPICAL | Status: DC | PRN
  Administered 2021-11-28: 1000 mL

## 2021-11-28 MED ORDER — PROPOFOL 10 MG/ML IV BOLUS
INTRAVENOUS | Status: DC | PRN
  Administered 2021-11-28: 130 mg via INTRAVENOUS

## 2021-11-28 MED ORDER — ASPIRIN 81 MG PO CHEW
81.0000 mg | CHEWABLE_TABLET | Freq: Two times a day (BID) | ORAL | Status: DC
  Administered 2021-11-28 – 2021-12-02 (×8): 81 mg via ORAL
  Filled 2021-11-28 (×8): qty 1

## 2021-11-28 MED ORDER — ONDANSETRON HCL 4 MG/2ML IJ SOLN
INTRAMUSCULAR | Status: DC | PRN
  Administered 2021-11-28: 4 mg via INTRAVENOUS

## 2021-11-28 MED ORDER — METHOCARBAMOL 1000 MG/10ML IJ SOLN: 500.0000 mg | Freq: Four times a day (QID) | INTRAVENOUS | Status: DC | PRN

## 2021-11-28 SURGICAL SUPPLY — 54 items
ARTICULEZE HEAD (Hips) ×1 IMPLANT
BAG COUNTER SPONGE SURGICOUNT (BAG) ×1 IMPLANT
BAG SPNG CNTER NS LX DISP (BAG) ×1
BLADE CLIPPER SURG (BLADE) IMPLANT
BLADE SAW SGTL 18X1.27X75 (BLADE) ×1 IMPLANT
COVER SURGICAL LIGHT HANDLE (MISCELLANEOUS) ×1 IMPLANT
DRAPE C-ARM 42X72 X-RAY (DRAPES) ×1 IMPLANT
DRAPE INCISE 23X17 IOBAN STRL (DRAPES) ×2
DRAPE INCISE 23X17 STRL (DRAPES) IMPLANT
DRAPE INCISE IOBAN 23X17 STRL (DRAPES) ×2 IMPLANT
DRAPE STERI IOBAN 125X83 (DRAPES) ×1 IMPLANT
DRAPE SURG ISO 125X83 STRL (DRAPES) IMPLANT
DRAPE U-SHAPE 47X51 STRL (DRAPES) ×3 IMPLANT
DRSG AQUACEL AG ADV 3.5X10 (GAUZE/BANDAGES/DRESSINGS) ×1 IMPLANT
ELECT BLADE 4.0 EZ CLEAN MEGAD (MISCELLANEOUS) ×1
ELECT BLADE 6.5 EXT (BLADE) IMPLANT
ELECT REM PT RETURN 9FT ADLT (ELECTROSURGICAL) ×1
ELECTRODE BLDE 4.0 EZ CLN MEGD (MISCELLANEOUS) IMPLANT
ELECTRODE REM PT RTRN 9FT ADLT (ELECTROSURGICAL) ×1 IMPLANT
FACESHIELD WRAPAROUND (MASK) ×2 IMPLANT
FACESHIELD WRAPAROUND OR TEAM (MASK) ×2 IMPLANT
GLOVE BIOGEL PI IND STRL 8 (GLOVE) ×2 IMPLANT
GLOVE ECLIPSE 8.0 STRL XLNG CF (GLOVE) ×1 IMPLANT
GLOVE ORTHO TXT STRL SZ7.5 (GLOVE) ×2 IMPLANT
GOWN STRL REUS W/ TWL LRG LVL3 (GOWN DISPOSABLE) ×2 IMPLANT
GOWN STRL REUS W/ TWL XL LVL3 (GOWN DISPOSABLE) ×2 IMPLANT
GOWN STRL REUS W/TWL LRG LVL3 (GOWN DISPOSABLE) ×2
GOWN STRL REUS W/TWL XL LVL3 (GOWN DISPOSABLE) ×2
HANDPIECE INTERPULSE COAX TIP (DISPOSABLE) ×1
HEAD ARTICULEZE (Hips) IMPLANT
KIT BASIN OR (CUSTOM PROCEDURE TRAY) ×1 IMPLANT
KIT TURNOVER KIT B (KITS) ×1 IMPLANT
LINER NEUTRAL 52X36MM PLUS 4 (Liner) IMPLANT
NS IRRIG 1000ML POUR BTL (IV SOLUTION) ×1 IMPLANT
PACK TOTAL JOINT (CUSTOM PROCEDURE TRAY) ×1 IMPLANT
PAD ARMBOARD 7.5X6 YLW CONV (MISCELLANEOUS) ×1 IMPLANT
PIN SECTOR W/GRIP ACE CUP 52MM (Hips) IMPLANT
SET HNDPC FAN SPRY TIP SCT (DISPOSABLE) ×1 IMPLANT
STAPLER VISISTAT 35W (STAPLE) IMPLANT
STEM FEMORAL SZ5 HIGH ACTIS (Stem) IMPLANT
STRIP CLOSURE SKIN 1/2X4 (GAUZE/BANDAGES/DRESSINGS) ×2 IMPLANT
SUT ETHIBOND NAB CT1 #1 30IN (SUTURE) ×1 IMPLANT
SUT MNCRL AB 4-0 PS2 18 (SUTURE) IMPLANT
SUT VIC AB 0 CT1 27 (SUTURE) ×1
SUT VIC AB 0 CT1 27XBRD ANBCTR (SUTURE) ×1 IMPLANT
SUT VIC AB 1 CT1 27 (SUTURE) ×1
SUT VIC AB 1 CT1 27XBRD ANBCTR (SUTURE) ×1 IMPLANT
SUT VIC AB 2-0 CT1 27 (SUTURE) ×2
SUT VIC AB 2-0 CT1 TAPERPNT 27 (SUTURE) ×1 IMPLANT
TOWEL GREEN STERILE (TOWEL DISPOSABLE) ×1 IMPLANT
TOWEL GREEN STERILE FF (TOWEL DISPOSABLE) ×1 IMPLANT
TRAY FOLEY W/BAG SLVR 16FR (SET/KITS/TRAYS/PACK) ×1
TRAY FOLEY W/BAG SLVR 16FR ST (SET/KITS/TRAYS/PACK) IMPLANT
WATER STERILE IRR 1000ML POUR (IV SOLUTION) ×2 IMPLANT

## 2021-11-28 NOTE — ED Notes (Signed)
[facility] on site to transfer patient to [facility]

## 2021-11-28 NOTE — H&P (Signed)
History and Physical    PLEASE NOTE THAT DRAGON DICTATION SOFTWARE WAS USED IN THE CONSTRUCTION OF THIS NOTE.   Penny Henry JSE:[id number] DOB: [birth year] DOA: 11/27/2021  PCP: Penny Malta, MD  Patient coming from: home   I have personally briefly reviewed patient's old medical records in [facility]  Chief Complaint: left hip pain  HPI: Penny Henry is a 81 y.o. female with medical history significant for generalized anxiety disorder, who is admitted to [facility] on 11/27/2021 by way of transfer from [facility] emergency department with acute left femoral neck fracture after presenting from home to the latter facility complaining of acute left hip pain.  Patient reports tripping while attempting to ambulate at home, resulting in a ground level fall during which left hip was the principal point of contact with the floor below. As a result of this fall, the patient reports immediate development of sharp left hip pain, with radiation into the  left   groin. States that this pain has been constant since onset, with exacerbation when attempting to move the left  lower extremity.  As a consequence of the associated intensity of this discomfort, reports that she is unable to bear weight on the left  lower extremity at this time.  This is relative to baseline functional status in which the patient lives independently as well as baseline ambulatory status in which the patient reports the ability to ambulate without assistance, including no need for support devices.  Otherwise, denies any acute arthralgias or myalgias as a result of the above fall.  Denies any acute numbness or paresthesias in bilateral lower extremities, and confirms that left  hip representations a native joint.  Did not hit head as a component of this fall, and denies any associated loss of consciousness.  Denies any preceding or associated chest pain, shortness of breath, diaphoresis, palpitations,  nausea, vomiting, dizziness, presyncope, or syncope.  Denies any subsequent headache, neck pain, blurry vision, or diplopia.  On a daily baby aspirin as an outpatient, but otherwise, denies any additional blood thinners at home.  Most recent baby aspirin taken on the morning of 11/27/2021.  denies any recent orthopnea, PND, or peripheral edema.   Of note, the patient is established with Dr. Allie Bossier of [facility] as an outpatient.       [facility] ED Course:  Vital signs in the ED were notable for the following: Afebrile; heart rate 81-94; systolic blood pressures in the 130s to 150s; respiratory rate 15-18, oxygen saturation 95 to 100% on room air.  Labs were notable for the following: BMP notable for sodium 138, creatinine 0.92.  CBC notable for platelets of count 11,100, hemoglobin 13.2, platelet count 170.  INR 1.0.  Imaging and additional notable ED work-up: EKG showed sinus rhythm with heart rate 98, normal intervals, no evidence of T wave or ST changes, including no evidence of ST elevation.  Chest x-ray showed no evidence of acute cardiopulmonary process, including evidence of infiltrate, edema, effusion, or pneumothorax.  Plain films of the left femur showed acute superiorly displaced left femoral neck fracture.  EDP discussed the patient's case and imaging with the on-call [facility] orthopedic surgeon, Dr.  Christell Constant, who recommended admission to the hospitalist service at [facility] for further evaluation and management of acute left  hip fracture.  Dr. Christell Constant conveys that [name] will formally consult, and will evaluate the patient in the morning, with anticipation of taking patient to OR  on 11/2 for definitive surgical intervention regarding presenting acute left femoral neck fracture.  He requests that the patient be made n.p.o. at midnight.  While in the ED, the following were administered: Zofran 4 mg IV x1, Dilaudid 0.5 mg IV x1, Dilaudid 1.0 mg IV x1.  Subsequently, the  patient was [facility] for further evaluation management presenting acute left femoral neck fracture stemming from ground-level mechanical fall experienced at home earlier in the day.     Review of Systems: As per HPI otherwise 10 point review of systems negative.   Past Medical History:  Diagnosis Date   Acid reflux disease    Anxiety    Borderline hypercholesterolemia    Insomnia    Irritable bowel syndrome     Past Surgical History:  Procedure Laterality Date   KNEE SURGERY      Social History:  reports that she has never smoked. She has never used smokeless tobacco. She reports that she does not currently use alcohol. She reports that she does not currently use drugs.   Allergies  Allergen Reactions   Doxycycline    Doxycycline Hyclate Hives    Other reaction(s): Other (See Comments) Unknown   Povidone Iodine Hives    Family History  Problem Relation Age of Onset   Arthritis Mother    Arthritis Father     Family history reviewed and not pertinent    Prior to Admission medications   Medication Sig Start Date End Date Taking? Authorizing Provider  buPROPion ER (WELLBUTRIN SR) 100 MG 12 hr tablet Take 100 mg by mouth daily.   Yes [provider]  aspirin EC 81 MG tablet Take 81 mg by mouth daily.     [provider]  Azelastine HCl 0.15 % SOLN  07/29/14   [provider]  Bioflavonoid Products (ESTER C PO) Take 1 tablet by mouth daily.     [provider]  celecoxib (CELEBREX) 200 MG capsule Take 1 capsule (200 mg total) by mouth 2 (two) times daily as needed. 06/28/21   Kathryne HitchBlackman, Christopher Y, MD  cetirizine (ZYRTEC) 10 MG tablet Take 10 mg by mouth daily as needed.     [provider]  Cholecalciferol (VITAMIN D-3) 5000 units TABS Take 1 tablet by mouth daily.    [provider]  dicyclomine (BENTYL) 20 MG tablet Take 20 mg by mouth 4 (four) times daily as needed.     [provider]   escitalopram (LEXAPRO) 10 MG tablet escitalopram 10 mg tablet    [provider]  estradiol (ESTRACE) 1 MG tablet estradiol 1 mg tablet  Take 1 tablet(s) every day by oral route.    [provider]  FIBER ADULT GUMMIES PO Take 1 tablet by mouth daily.    [provider]  fluocinonide gel (LIDEX) 0.05 % fluocinonide 0.05 % topical gel    [provider]  Glucosamine Sulfate 1000 MG CAPS Take by mouth.    [provider]  HYDROcodone-acetaminophen (NORCO/VICODIN) 5-325 MG tablet Take 1 tablet by mouth every 6 (six) hours as needed for moderate pain. 08/20/20   Kathryne HitchBlackman, Christopher Y, MD  LORazepam (ATIVAN) 1 MG tablet Take 1.5 mg by mouth daily. 08/20/17   [provider]  Magnesium 500 MG CAPS Take 1 capsule by mouth daily.     [provider]  methylPREDNISolone (MEDROL) 4 MG tablet Medrol dose pack. Take as instructed 07/28/18   Kathryne HitchBlackman, Christopher Y, MD  Multiple Vitamin (MULTI-VITAMINS  PO) Take by mouth.    [provider]  multivitamin-lutein (OCUVITE-LUTEIN) CAPS capsule Take 1 capsule by mouth daily.    [provider]  Omega-3 Fatty Acids (FISH OIL) 1200 MG CPDR Take 1 tablet by mouth 2 (two) times daily.     [provider]  progesterone (PROMETRIUM) 100 MG capsule Take 100 mg by mouth daily.    [provider]  traMADol (ULTRAM) 50 MG tablet Take 1-2 tablets (50-100 mg total) by mouth every 6 (six) hours as needed. 02/07/20   Kirtland Bouchard, PA-C  traZODone (DESYREL) 50 MG tablet Take 50 mg by mouth daily.    [provider]     Objective    Physical Exam: Vitals:   11/27/21 2230 11/27/21 2322 11/28/21 0049 11/28/21 0135  BP: (!) 143/88 136/81 130/78 (!) 150/89  Pulse: 93 84 81 (!) 105  Resp: 15 15 15    Temp:  98.4 F (36.9 C)  98.4 F (36.9 C)  TempSrc:  Oral  Oral  SpO2: 100% 99% 100% 98%  Weight:      Height:        General: appears to be stated age; alert,  oriented Skin: warm, dry, no rash Head:  AT/[place] Mouth:  Oral mucosa membranes appear moist, normal dentition Neck: supple; trachea midline Heart:  RRR; did not appreciate any M/R/G Lungs: CTAB, did not appreciate any wheezes, rales, or rhonchi Abdomen: + BS; soft, ND, NT Vascular: 2+ pedal pulses b/l; 2+ radial pulses b/l Extremities: no peripheral edema, no muscle wasting; left lower extremity appears externally rotated and shorter than the right counterpart Neuro: sensation intact in upper and lower extremities b/l; deferred assessment of strength involving the left lower extremity in the setting of acute left femoral neck fracture.     Labs on Admission: I have personally reviewed following labs and imaging studies  CBC: Recent Labs  Lab 11/27/21 2117  WBC 11.1*  NEUTROABS 8.8*  HGB 13.2  HCT 40.8  MCV 92.1  PLT 170   Basic Metabolic Panel: Recent Labs  Lab 11/27/21 2117  NA 138  K 4.2  CL 103  CO2 27  GLUCOSE 115*  BUN 23  CREATININE 0.92  CALCIUM 9.2   GFR: Estimated Creatinine Clearance: 45.8 mL/min (by C-G formula based on SCr of 0.92 mg/dL). Liver Function Tests: No results for input(s): "AST", "ALT", "ALKPHOS", "BILITOT", "PROT", "ALBUMIN" in the last 168 hours. No results for input(s): "LIPASE", "AMYLASE" in the last 168 hours. No results for input(s): "AMMONIA" in the last 168 hours. Coagulation Profile: Recent Labs  Lab 11/27/21 2117  INR 1.0   Cardiac Enzymes: No results for input(s): "CKTOTAL", "CKMB", "CKMBINDEX", "TROPONINI" in the last 168 hours. BNP (last 3 results) No results for input(s): "PROBNP" in the last 8760 hours. HbA1C: No results for input(s): "HGBA1C" in the last 72 hours. CBG: No results for input(s): "GLUCAP" in the last 168 hours. Lipid Profile: No results for input(s): "CHOL", "HDL", "LDLCALC", "TRIG", "CHOLHDL", "LDLDIRECT" in the last 72 hours. Thyroid Function Tests: No results for input(s): "TSH", "T4TOTAL",  "FREET4", "T3FREE", "THYROIDAB" in the last 72 hours. Anemia Panel: No results for input(s): "VITAMINB12", "FOLATE", "FERRITIN", "TIBC", "IRON", "RETICCTPCT" in the last 72 hours. Urine analysis: No results found for: "COLORURINE", "APPEARANCEUR", "LABSPEC", "PHURINE", "GLUCOSEU", "HGBUR", "BILIRUBINUR", "KETONESUR", "PROTEINUR", "UROBILINOGEN", "NITRITE", "LEUKOCYTESUR"  Radiological Exams on Admission: DG Chest Port 1 View  Result Date: 11/27/2021 CLINICAL DATA:  fall hip fracture EXAM: PORTABLE CHEST 1 VIEW COMPARISON:  None  Available. FINDINGS: The heart and mediastinal contours are within normal limits. No focal consolidation. No pulmonary edema. No pleural effusion. No pneumothorax. No acute osseous abnormality. IMPRESSION: No active disease. Electronically Signed   By: Iven Finn M.D.   On: 11/27/2021 21:33   DG FEMUR MIN 2 VIEWS LEFT  Result Date: 11/27/2021 CLINICAL DATA:  [id number] Pain [id number] EXAM: LEFT FEMUR 2 VIEWS COMPARISON:  Ultrasound pelvis 08/22/2020 FINDINGS: Acute superiorly displaced left femoral neck fracture. Distally no acute displaced fracture of the femur. Partially visualized left knee grossly unremarkable. No left hip dislocation. Soft tissues are unremarkable. Coarsely calcified lesion overlying the pelvis is nonspecific but could consistent with a known degenerative uterine fibroid. IMPRESSION: Acute superiorly displaced left femoral neck fracture. Electronically Signed   By: Iven Finn M.D.   On: 11/27/2021 20:58     EKG: Independently reviewed, with result as described above.    Assessment/Plan   Principal Problem:   Fracture of femoral neck, left (HCC) Active Problems:   Fall at home, initial encounter   Leukocytosis   GAD (generalized anxiety disorder)        #) Acute left femoral neck fracture: confirmed via presenting plain films and stemming from ground level mechanical fall without associated loss of consciousness that occurred earlier  on the day of admission, as further described above, resulting in immediate develop of acute left hip pain.  At this time, the left lower extremity appears neurovascularly intact, and patient reports adequate pain control.  On daily baby aspirin as outpatient, in the absence of any additional blood thinners, as further detailed above.  The patient's case/imaging were discussed with the on-call orthopedic surgeon, Dr. Laurance Flatten of [facility],  who recommended admission to the hospitalist service for further evaluation/management of acute  left hip fx, including preoperative medical optimization, and plan to take pt to the OR tomorrow (11/2) for definitive surgical management. Consistent with this, orthopedic surgery requests that pt be made NPO at MN.   Lyndel Safe Score for this patient in the context of anticipated aforementioned orthopedic surgery conveys a  0.22% perioperative risk for significant cardiac event. No evidence to suggest acutely decompensated heart failure or acute MI. Consequently, no absolute contraindications to proceeding with proposed orthopedic surgery at this time.    Plan: Formal orthopedic surgery consult for definitive surgical management, with plan to take patient to the OR tomorrow (11/2). NPO after MN in anticipation of this procedure.  No pharmacologic anticoagulation leading up to this anticipated surgery.  Holding home daily baby aspirin.  SCD's. Prn IV Dilaudid. Anticipate postoperative PT consult. Pre-op EKG has been ordered. Check 25-hydroxy vit D level to assess for any underlying pathological contribution towards the patient's fracture stemming from associated vitamin deficiency. Type and screen ordered.  Incentive spirometry.           #) Ground level mechanical fall: The patient reports a ground level mechanical fall earlier today in which she tripped without any associated loss of consciousness. Appears to be purely mechanical in nature, without clinical evidence at  this time to suggest contributory dizziness, presyncope, syncope, or acute ischemic CVA. Does not appear to have hit head as component of this fall. While this fall appears to be purely mechanical in nature, will also check urinalysis to evaluate for any underlying infectious contribution.   Plan: Check urinalysis, as above.  Repeat CMP and CBC with differential in the morning. Fall precautions. Anticipate postoperative PT consult.           #)  Leukocytosis: Presenting CBC reflects mildly elevated white cell count of 11,100. Suspect that this is reactive in nature in the setting of presenting ground-level mechanical fall as well as associated acute hip fracture.  No evidence to suggest underlying infectious process at this time, including chest x-ray, which showed no evidence of acute cardiopulmonary process..  Will check urinalysis to further evaluate for any underlying infectious contribution.  Plan: Check urinalysis, as above.  Repeat CBC with diff in the morning.             #) Generalized anxiety disorder: On scheduled Wellbutrin and Lexapro as an outpatient.  Additionally, she is on scheduled daily Ativan at home as well.  Plan: In the setting of current n.p.o. status, will hold home Wellbutrin, Lexapro, and oral Ativan.  Instead, I have placed prn order for IV Ativan while remaining n.p.o.        DVT prophylaxis: SCD's   Code Status: Full code Family Communication: none Disposition Plan: Per Rounding Team Consults called: [facility] EDP d/w on-call [facility] orthopedic surgeon, Dr. Christell Constant, As further detailed above;  Admission status: Inpatient    PLEASE NOTE THAT DRAGON DICTATION SOFTWARE WAS USED IN THE CONSTRUCTION OF THIS NOTE.   Chaney Born Blakely Gluth DO [facility]  From 7PM - 7AM   11/28/2021, 1:56 AM

## 2021-11-28 NOTE — Progress Notes (Signed)
Patient ID: Penny Henry, female   DOB: Dec 11, 1940, 81 y.o.   MRN: [id number] I know Ms. Fargnoli very well having seen her for many years as a patient.  She had an unfortunate mechanical fall yesterday injuring her left hip.  She was brought to [facility] and found to have a displaced femoral neck fracture.  She is a very active and mobile 81 year old female.  I came by the bedside to see her and talk to family as well.  We have recommended hip replacement to treat her acute displaced left hip femoral neck fracture.  She has been n.p.o.  Her vital signs and labs appear stable.  I discussed the risks and benefits of surgery in detail and she does not wish for [place] to proceed to surgery today.

## 2021-11-28 NOTE — Consult Note (Addendum)
Orthopedic Surgery Consult Note  Assessment: Patient is a 81 y.o. female with left femoral neck fracture   Plan: -Planning for operative management today  -Diet: NPO for procedure -DVT ppx: hold -Antibiotics: ancef on call to OR -Weight bearing status: NWB LLE -Has an osteoporosis defining fracture, should see her primary care to talk about treatment options -PT/OT evaluate and treat post-operatively -Pain control -Dispo: pending completion of operative plans   Briefly discussed hip hemiarthroplasty versus total hip arthroplasty as treatment options. Told her that Dr. Ninfa Linden will be the one doing it since they have a long-standing relationship. I told the patient that he will go over her options in more detail.   ___________________________________________________________________________   Chief complaint: left hip pain  History:  Patient is a 80 y.o. female who had a ground level fall and had acute onset of left hip pain Pain location: left hip, no pain elsewhere Severity of pain: severe at onset but now feels sore as pain medications are giving her relief Mechanism of injury: ground level fall Duration of symptoms: 1 day Paresthesias and numbness: denies  Past medical history:  GERD Anxiety Insomnia Osteoporosis Irritable bowel syndrome  Allergies: doxycycline, iodine   Past surgical history:  Knee surgery  Social history: Denies use of nicotine-containing products (cigarettes, vaping, smokeless, etc.) Alcohol use: denies Denies use of recreational drugs  Family history: -not pertinent to acute femoral neck fracture   Physical Exam:  General: no acute distress, appears stated age Neurologic: alert, answering questions appropriately, following commands Cardiovascular: regular rate, no cyanosis Respiratory: unlabored breathing on room air, symmetric chest rise Psychiatric: appropriate affect, normal cadence to speech  MSK:   -Bilateral upper  extremities  No tenderness to palpation over extremity Fires deltoid, biceps, triceps, wrist extensors, wrist flexors, finger extensors, finger flexors Has stiffness in her left shoulder and decreased range of motion (chronic, no new changes)  AIN/PIN/IO intact  Palpable radial pulse  Sensation intact to light touch in median/ulnar/radial/axillary nerve distributions  Hand warm and well perfused  -Right lower extremities  No tenderness to palpation over extremity Fires hip flexors, quadriceps, hamstrings, tibialis anterior, gastrocnemius and soleus, extensor hallucis longus Plantarflexes and dorsiflexes toes Sensation intact to light touch in sural, saphenous, tibial, deep peroneal, and superficial peroneal nerve distributions Foot warm and well perfused  -Left lower extremity  No tenderness to palpation over extremity, except over the left hip Fires quadriceps, hamstrings, tibialis anterior, gastrocnemius and soleus, extensor hallucis longus Not firing hip flexors due to pain from know hip fracture Plantarflexes and dorsiflexes toes Sensation intact to light touch in sural, saphenous, tibial, deep peroneal, and superficial peroneal nerve distributions Foot warm and well perfused  Imaging: XR of the left femur from 11/27/2021 was independently reviewed and interpreted, showing left femoral neck fracture. No dislocation.    Patient name: Penny Henry Patient MRN: [id number] Date: 11/28/21

## 2021-11-28 NOTE — Progress Notes (Signed)
Patient admitted after midnight, please see H&P.  Here with hip fracture from mechanical fall.  Plan for hip replacement today with Dr. Ninfa Linden.    Eulogio Bear DO

## 2021-11-28 NOTE — Progress Notes (Signed)
Resident returned from surgery with foley intact draining amber colored urine, medicated x1 for headache stating she is not having pain in her hips at this time. Doctor rounded on resident and gave clearance to advance diet. Family at bedside call light within reach

## 2021-11-28 NOTE — ED Notes (Signed)
Report given to [facility]  

## 2021-11-28 NOTE — Anesthesia Preprocedure Evaluation (Addendum)
Anesthesia Evaluation  Patient identified by MRN, date of birth, ID band Patient awake    Reviewed: Allergy & Precautions, NPO status , Patient's Chart, lab work & pertinent test results  Airway Mallampati: II       Dental   Pulmonary neg pulmonary ROS   breath sounds clear to auscultation       Cardiovascular negative cardio ROS  Rhythm:Regular Rate:Normal     Neuro/Psych  PSYCHIATRIC DISORDERS      History noted Dr. Nyoka Cowden    GI/Hepatic Neg liver ROS,GERD  ,,  Endo/Other  negative endocrine ROS    Renal/GU negative Renal ROS     Musculoskeletal   Abdominal   Peds  Hematology   Anesthesia Other Findings   Reproductive/Obstetrics                             Anesthesia Physical Anesthesia Plan  ASA: 2  Anesthesia Plan: General   Post-op Pain Management: Tylenol PO (pre-op)*   Induction: Intravenous  PONV Risk Score and Plan: 3 and Ondansetron, Dexamethasone and Midazolam  Airway Management Planned:   Additional Equipment:   Intra-op Plan:   Post-operative Plan: Extubation in OR  Informed Consent: I have reviewed the patients History and Physical, chart, labs and discussed the procedure including the risks, benefits and alternatives for the proposed anesthesia with the patient or authorized representative who has indicated his/her understanding and acceptance.     Dental advisory given  Plan Discussed with: CRNA and Anesthesiologist  Anesthesia Plan Comments:        Anesthesia Quick Evaluation

## 2021-11-28 NOTE — Transfer of Care (Signed)
Immediate Anesthesia Transfer of Care Note  Patient: Penny Henry  Procedure(s) Performed: TOTAL HIP ARTHROPLASTY ANTERIOR APPROACH (Left: Hip)  Patient Location: PACU  Anesthesia Type:General  Level of Consciousness: awake, alert , and oriented  Airway & Oxygen Therapy: Patient Spontanous Breathing and Patient connected to face mask oxygen  Post-op Assessment: Report given to RN and Post -op Vital signs reviewed and stable  Post vital signs: Reviewed and stable  Last Vitals:  Vitals Value Taken Time  BP 127/82 11/28/21 1210  Temp    Pulse 98 11/28/21 1212  Resp 11 11/28/21 1212  SpO2 94 % 11/28/21 1212  Vitals shown include unvalidated device data.  Last Pain:  Vitals:   11/28/21 0912  TempSrc:   PainSc: 3          Complications: No notable events documented.

## 2021-11-28 NOTE — Anesthesia Procedure Notes (Signed)
Procedure Name: Intubation Date/Time: 11/28/2021 10:27 AM  Performed by: Genelle Bal, CRNAPre-anesthesia Checklist: Patient identified, Emergency Drugs available, Suction available and Patient being monitored Patient Re-evaluated:Patient Re-evaluated prior to induction Oxygen Delivery Method: Circle system utilized Preoxygenation: Pre-oxygenation with 100% oxygen Induction Type: IV induction Ventilation: Mask ventilation without difficulty Laryngoscope Size: Miller and 2 Grade View: Grade I Tube type: Oral Tube size: 7.0 mm Number of attempts: 1 Airway Equipment and Method: Stylet Placement Confirmation: ETT inserted through vocal cords under direct vision, positive ETCO2 and breath sounds checked- equal and bilateral Secured at: 21 cm Tube secured with: Tape Dental Injury: Teeth and Oropharynx as per pre-operative assessment

## 2021-11-28 NOTE — Op Note (Signed)
Operative Note  Date of operation: 11/28/2021 Preoperative diagnosis: Left hip displaced femoral neck fracture Postoperative diagnosis: Same  Procedure: Left direct anterior total hip arthroplasty  Implants: DePuy sector GRIPTION #opponent size 52, 36+4 polythene liner, size 5 Actis femoral component with high offset, 36+5 metal head ball  Surgeon: Lind Guest. Ninfa Linden, MD Assistant: Benita Stabile, PA-C  Anesthesia: General Antibiotics: 2 g IV Ancef EBL: 086 cc Complications: None  Indications: The patient is an 81 year old female well-known to me.  She is been a long-term patient of mine and yesterday she was doing sweeping leaves around her garage when she actually fell off of a brick wall injuring her left hip.  She had inability ambulate on the left hip and was brought to the emergency room.  She was found to have a displaced left hip femoral neck fracture.  We have recommended a total hip arthroplasty to treat this injury given her high mobility and level of function.  She did not walk with any assistive device prior to the surgery and is very mobile.  I have recommended a total hip arthroplasty and described the risks and benefits of the surgery in detail.  I described the rationale behind proceeding with surgery as well as nonoperative and operative treatment options.  She understands her goals are decreased pain, improve mobility and overall improve quality of life.  Procedure description: After informed consent was obtained and the appropriate left operative hip was marked, the patient was brought to the operating room where general anesthesia was obtained while she was on the stretcher.  A Foley catheter was placed and traction boots were then placed on both of her feet.  She was then next placed supine on the Hana fracture table with a perineal post in place in both legs and inline skeletal traction devices but no traction applied.  Her left operative hip was assessed  radiographically.  He was then prepped and draped with ChloraPrep and sterile drapes.  A timeout was called and she was notified as the correct patient and the correct left hip.  An incision was then made just inferior and posterior to the ASIS and carried slightly obliquely down the leg.  Dissection was carried down the tensor fascia lata muscle and the tensor fascia was then divided longitudinally to proceed with directed approach to the hip.  Circumflex vessels were identified and cauterized and the hip capsule was then identified and opened up finding a very large hemarthrosis consistent with a femoral neck fracture and right away a displaced femoral neck fracture can be seen.  Cobra retractors were placed around the medial lateral femoral neck distal to the fracture and a femoral neck cut was made with an oscillating saw just distal to the fracture and proximal to the lesser trochanter.  This was completed with an osteotome.  Remnants of the femoral neck were then removed and a corkscrew guide was placed in the femoral head and the femoral head was removed in its entirety.  The acetabular was then cleaned of any bony debris and remnants of the acetabular labrum was removed.  A bent Hohmann was placed over the medial acetabular rim.  Reaming was then initiated from a size 43 reamer and stepwise increments going up to size 51 reamer with all reamers placed under regularization the last reamer was placed in direct fluoroscopy in order to obtain the depth of reaming, inclination and anteversion.  The real DePuy sector GRIPTION acetabular component size 52 was then placed without difficulty under  direct visitation and fluoroscopy.  A 36+4 polythene liner was then placed within that size 52 acetabular component.  Attention was then turned to the femur.  With the left leg externally rotated to 120 degrees, extended and a deducted, the leg was brought down and under the other leg.  A Mueller retractor was placed  medially and a Hohmann or retractor was placed behind the greater trochanter.  A box cutting osteotome was used in the femoral canal and the lateral joint capsule was released.  Broaching was then initiated from a size 0 broach going up to a size 5 broach.  With a 5 broach in place we trialed a high offset femoral neck based over anatomy at 36+1.5 hip ball.  This was then reduced into the acetabulum for the leg was brought back over and up.  We assessed it radiographically and clinically and felt like we needed just a little bit more leg length.  The trial components were dislocated and removed.  We then placed the real Actis femoral component with high offset size 5 and the real 36+5 metal head ball.  Again this reduced in the acetabulum we are pleased with the assessment of his stability as well as offset and leg length assessed radiographically and clinically.  The soft tissue was then irrigated normal saline solution.  The joint capsule was closed with interrupted #1 Ethibond suture followed by #1 Vicryl to close the tensor fascia.  0 Vicryl was used to close the deep tissue and 2-0 Vicryl was used to close the subcutaneous tissue.  The skin was closed with staples.  An Aquacel dressing was placed over the incision.  The patient was taken off the Hana table, awakened, extubated and taken recovery in stable addition with all final counts being correct and no complications noted.  Rexene Edison, PA-C did assisted in the entire case from beginning to end his assistance was medically necessary and needed for soft tissue retraction and management, helping guide implant placement and a layered closure of the wound.

## 2021-11-28 NOTE — Anesthesia Postprocedure Evaluation (Signed)
Anesthesia Post Note  Patient: Penny Henry  Procedure(s) Performed: TOTAL HIP ARTHROPLASTY ANTERIOR APPROACH (Left: Hip)     Patient location during evaluation: PACU Anesthesia Type: General Level of consciousness: awake Pain management: pain level controlled Vital Signs Assessment: post-procedure vital signs reviewed and stable Respiratory status: spontaneous breathing Cardiovascular status: stable Postop Assessment: no apparent nausea or vomiting Anesthetic complications: no   No notable events documented.  Last Vitals:  Vitals:   11/28/21 1300 11/28/21 1315  BP: 113/71 116/65  Pulse: 93 93  Resp: 12 12  Temp:  36.9 C  SpO2: 93% 96%    Last Pain:  Vitals:   11/28/21 1211  TempSrc:   PainSc: 0-No pain                 Makennah Omura

## 2021-11-29 ENCOUNTER — Other Ambulatory Visit: Payer: Self-pay

## 2021-11-29 DIAGNOSIS — S72002A Fracture of unspecified part of neck of left femur, initial encounter for closed fracture: Secondary | ICD-10-CM | POA: Diagnosis not present

## 2021-11-29 LAB — URINALYSIS, COMPLETE (UACMP) WITH MICROSCOPIC
Bacteria, UA: NONE SEEN
Bilirubin Urine: NEGATIVE
Glucose, UA: NEGATIVE mg/dL
Hgb urine dipstick: NEGATIVE
Ketones, ur: 5 mg/dL — AB
Leukocytes,Ua: NEGATIVE
Nitrite: NEGATIVE
Protein, ur: NEGATIVE mg/dL
Specific Gravity, Urine: 1.023 (ref 1.005–1.030)
pH: 5 (ref 5.0–8.0)

## 2021-11-29 LAB — CBC
HCT: 29.4 % — ABNORMAL LOW (ref 36.0–46.0)
Hemoglobin: 9.4 g/dL — ABNORMAL LOW (ref 12.0–15.0)
MCH: 30.2 pg (ref 26.0–34.0)
MCHC: 32 g/dL (ref 30.0–36.0)
MCV: 94.5 fL (ref 80.0–100.0)
Platelets: 129 10*3/uL — ABNORMAL LOW (ref 150–400)
RBC: 3.11 MIL/uL — ABNORMAL LOW (ref 3.87–5.11)
RDW: 12.4 % (ref 11.5–15.5)
WBC: 9.2 10*3/uL (ref 4.0–10.5)
nRBC: 0 % (ref 0.0–0.2)

## 2021-11-29 MED ORDER — PRESERVISION AREDS 2 PO CAPS: 1.0000 | ORAL_CAPSULE | Freq: Every day | ORAL | Status: DC

## 2021-11-29 MED ORDER — PANTOPRAZOLE SODIUM 40 MG PO TBEC
40.0000 mg | DELAYED_RELEASE_TABLET | Freq: Every day | ORAL | Status: DC
  Administered 2021-11-30 – 2021-12-02 (×3): 40 mg via ORAL
  Filled 2021-11-29 (×3): qty 1

## 2021-11-29 MED ORDER — SODIUM CHLORIDE 0.9 % IV SOLN: INTRAVENOUS | Status: DC

## 2021-11-29 MED ORDER — PRESERVISION AREDS 2 PO CAPS
2.0000 | ORAL_CAPSULE | Freq: Every day | ORAL | Status: DC
  Administered 2021-11-29 – 2021-12-02 (×4): 2 via ORAL
  Filled 2021-11-29 (×4): qty 2

## 2021-11-29 MED ORDER — MAGNESIUM OXIDE -MG SUPPLEMENT 400 (240 MG) MG PO TABS
400.0000 mg | ORAL_TABLET | Freq: Two times a day (BID) | ORAL | Status: DC
  Administered 2021-11-29 – 2021-12-02 (×7): 400 mg via ORAL
  Filled 2021-11-29 (×7): qty 1

## 2021-11-29 MED ORDER — LORAZEPAM 1 MG PO TABS
1.5000 mg | ORAL_TABLET | Freq: Every day | ORAL | Status: DC | PRN
  Administered 2021-11-29 – 2021-12-02 (×5): 1.5 mg via ORAL
  Filled 2021-11-29 (×5): qty 1

## 2021-11-29 MED ORDER — BUPROPION HCL ER (SR) 100 MG PO TB12
100.0000 mg | ORAL_TABLET | Freq: Every day | ORAL | Status: DC
  Administered 2021-11-29 – 2021-12-02 (×4): 100 mg via ORAL
  Filled 2021-11-29 (×4): qty 1

## 2021-11-29 MED ORDER — DIPHENHYDRAMINE-ZINC ACETATE 2-0.1 % EX CREA
TOPICAL_CREAM | Freq: Three times a day (TID) | CUTANEOUS | Status: DC | PRN
  Filled 2021-11-29: qty 28

## 2021-11-29 MED ORDER — ESCITALOPRAM OXALATE 10 MG PO TABS
10.0000 mg | ORAL_TABLET | Freq: Every day | ORAL | Status: DC
  Administered 2021-11-29 – 2021-12-01 (×3): 10 mg via ORAL
  Filled 2021-11-29 (×4): qty 1

## 2021-11-29 NOTE — Discharge Instructions (Signed)

## 2021-11-29 NOTE — Progress Notes (Signed)
PROGRESS NOTE    Penny Henry  [id number] DOB: Jan 02, 1941 DOA: 11/27/2021 PCP: Serita Grammes, MD    Brief Narrative:  Penny Henry is a 81 y.o. female with medical history significant for generalized anxiety disorder, who is admitted to [facility] on 11/27/2021 by way of transfer from [place] emergency department with acute left femoral neck fracture after presenting from home to the latter facility complaining of acute left hip pain.  S/p repair.     Assessment and Plan:  Acute left femoral neck fracture:  -confirmed via presenting plain films and stemming from ground level mechanical fall without associated loss of consciousness  --s/p repair PT/OT consult      Ground level mechanical fall: -PT/OT eval   Generalized anxiety disorder: On scheduled Wellbutrin and Lexapro as an outpatient.  Additionally, she is on scheduled daily Ativan at home as well.   anemia -expected post op blood loss -transfuse for <7      DVT prophylaxis: SCDs Start: 11/28/21 1441 SCDs Start: 11/28/21 0154    Code Status: Full Code Family Communication:   Disposition Plan:  Level of care: Telemetry Surgical Status is: Inpatient Remains inpatient appropriate because: needs PT.OT eval    Consultants:  ortho   Subjective: Feeling well, some soreness  Objective: Vitals:   11/28/21 1700 11/28/21 1957 11/28/21 2140 11/29/21 0909  BP: 119/70 (!) 96/57 118/69 117/65  Pulse: 90   (!) 108  Resp: 12   17  Temp: (!) 97 F (36.1 C) 98 F (36.7 C) 99.1 F (37.3 C) 98.2 F (36.8 C)  TempSrc:  Oral Oral Oral  SpO2: 94%   94%  Weight:      Height:        Intake/Output Summary (Last 24 hours) at 11/29/2021 1149 Last data filed at 11/29/2021 [id number] Gross per 24 hour  Intake 1300 ml  Output 875 ml  Net 425 ml   Filed Weights   11/27/21 1917 11/28/21 0847  Weight: 65.8 kg 65.8 kg    Examination:   General: Appearance:    Well developed, well nourished  female in no acute distress     Lungs:     respirations unlabored  Heart:    Tachycardic.   MS:   All extremities are intact.    Neurologic:   Awake, alert, oriented x 3. No apparent focal neurological           defect.        Data Reviewed: I have personally reviewed following labs and imaging studies  CBC: Recent Labs  Lab 11/27/21 2117 11/28/21 0339 11/29/21 0311  WBC 11.1* 7.4 9.2  NEUTROABS 8.8* 5.7  --   HGB 13.2 11.9* 9.4*  HCT 40.8 36.0 29.4*  MCV 92.1 93.3 94.5  PLT 170 152 [id number]*   Basic Metabolic Panel: Recent Labs  Lab 11/27/21 2117 11/28/21 0339  NA 138 138  K 4.2 4.6  CL 103 103  CO2 27 30  GLUCOSE 115* 139*  BUN 23 20  CREATININE 0.92 0.92  CALCIUM 9.2 9.0  MG  --  2.1   GFR: Estimated Creatinine Clearance: 45.8 mL/min (by C-G formula based on SCr of 0.92 mg/dL). Liver Function Tests: Recent Labs  Lab 11/28/21 0339  AST 18  ALT 23  ALKPHOS 64  BILITOT 0.7  PROT 5.8*  ALBUMIN 3.4*   No results for input(s): "LIPASE", "AMYLASE" in the last 168 hours. No results for input(s): "AMMONIA" in the last 168  hours. Coagulation Profile: Recent Labs  Lab 11/27/21 2117  INR 1.0   Cardiac Enzymes: No results for input(s): "CKTOTAL", "CKMB", "CKMBINDEX", "TROPONINI" in the last 168 hours. BNP (last 3 results) No results for input(s): "PROBNP" in the last 8760 hours. HbA1C: No results for input(s): "HGBA1C" in the last 72 hours. CBG: No results for input(s): "GLUCAP" in the last 168 hours. Lipid Profile: No results for input(s): "CHOL", "HDL", "LDLCALC", "TRIG", "CHOLHDL", "LDLDIRECT" in the last 72 hours. Thyroid Function Tests: No results for input(s): "TSH", "T4TOTAL", "FREET4", "T3FREE", "THYROIDAB" in the last 72 hours. Anemia Panel: No results for input(s): "VITAMINB12", "FOLATE", "FERRITIN", "TIBC", "IRON", "RETICCTPCT" in the last 72 hours. Sepsis Labs: No results for input(s): "PROCALCITON", "LATICACIDVEN" in the last 168  hours.  Recent Results (from the past 240 hour(s))  Surgical pcr screen     Status: None   Collection Time: 11/28/21  8:32 AM   Specimen: Nasal Mucosa; Nasal Swab  Result Value Ref Range Status   MRSA, PCR NEGATIVE NEGATIVE Final   Staphylococcus aureus NEGATIVE NEGATIVE Final    Comment: (NOTE) The Xpert SA Assay (FDA approved for NASAL specimens in patients 70 years of age and older), is one component of a comprehensive surveillance program. It is not intended to diagnose infection nor to guide or monitor treatment. Performed at [facility], [address], [place], [place] [address]          Radiology Studies: DG Pelvis Portable  Result Date: 11/28/2021 CLINICAL DATA:  Postop total replacement of LEFT hip. EXAM: PORTABLE PELVIS 1-2 VIEWS COMPARISON:  Plain film of the LEFT femur dated 11/27/2021. FINDINGS: LEFT hip arthroplasty hardware is now in place. Hardware appears intact and appropriately positioned. Osseous alignment is anatomic. Expected postsurgical changes are seen within the overlying soft tissues. IMPRESSION: Status post LEFT hip arthroplasty. No evidence of surgical complicating feature. Electronically Signed   By: Franki Cabot M.D.   On: 11/28/2021 12:43   DG HIP UNILAT WITH PELVIS 1V LEFT  Result Date: 11/28/2021 CLINICAL DATA:  Left femoral neck fracture EXAM: DG HIP (WITH OR WITHOUT PELVIS) 1V*L* COMPARISON:  11/27/2021 FLUOROSCOPY TIME:  Radiation Exposure Index (as provided by the fluoroscopic device): 2.53 mGy If the device does not provide the exposure index: Fluoroscopy Time:  7.1 seconds Number of Acquired Images:  3 FINDINGS: Left hip prosthesis is now seen in satisfactory position. Calcified uterine fibroid is again noted. No other focal abnormality is seen. IMPRESSION: Status post left hip replacement Electronically Signed   By: Inez Catalina M.D.   On: 11/28/2021 11:53   DG C-Arm 1-60 Min-No Report  Result Date: 11/28/2021 Fluoroscopy was  utilized by the requesting physician.  No radiographic interpretation.   DG Chest Port 1 View  Result Date: 11/27/2021 CLINICAL DATA:  fall hip fracture EXAM: PORTABLE CHEST 1 VIEW COMPARISON:  None Available. FINDINGS: The heart and mediastinal contours are within normal limits. No focal consolidation. No pulmonary edema. No pleural effusion. No pneumothorax. No acute osseous abnormality. IMPRESSION: No active disease. Electronically Signed   By: Iven Finn M.D.   On: 11/27/2021 21:33   DG FEMUR MIN 2 VIEWS LEFT  Result Date: 11/27/2021 CLINICAL DATA:  [id number] Pain [id number] EXAM: LEFT FEMUR 2 VIEWS COMPARISON:  Ultrasound pelvis 08/22/2020 FINDINGS: Acute superiorly displaced left femoral neck fracture. Distally no acute displaced fracture of the femur. Partially visualized left knee grossly unremarkable. No left hip dislocation. Soft tissues are unremarkable. Coarsely calcified lesion overlying the pelvis is  nonspecific but could consistent with a known degenerative uterine fibroid. IMPRESSION: Acute superiorly displaced left femoral neck fracture. Electronically Signed   By: Iven Finn M.D.   On: 11/27/2021 20:58        Scheduled Meds:  aspirin  81 mg Oral BID   docusate sodium  100 mg Oral BID   [START ON 11/30/2021] pantoprazole  40 mg Oral Daily   PreserVision AREDS 2  1 capsule Oral Daily   Continuous Infusions:  sodium chloride 75 mL/hr at 11/28/21 1701   methocarbamol (ROBAXIN) IV       LOS: 1 day    Time spent: 45 minutes spent on chart review, discussion with nursing staff, consultants, updating family and interview/physical exam; more than 50% of that time was spent in counseling and/or coordination of care.    Geradine Girt, DO [facility] Available via Epic secure chat 7am-7pm After these hours, please refer to coverage provider listed on [email or web address] 11/29/2021, 11:49 AM

## 2021-11-29 NOTE — Progress Notes (Signed)
Patient ID: Penny Henry, female   DOB: 02-17-1940, 81 y.o.   MRN: [id number] The patient is awake and alert with family at the bedside.  She did tolerate surgery well on her left broken hip.  She was able to get up with therapy today but did get lightheaded.  Her hemoglobin is 9.4.  This is acute blood loss anemia as a relates to her fracture and surgery.  Right now her vital signs are stable.  I did look at her left hip dressing and placed a new dressing.  There was minimal bloody drainage.  Her leg lengths are equal.  I did speak to her and her family at length about the goals of moving forward in terms of mobility and pain control.  Hopefully she will be able to transition home in a few days with home therapy coming to her house as long as family members can be there for her and help her out.  A lot will depend on what therapy has to say in terms of her mobility and safety with mobility.

## 2021-11-29 NOTE — Progress Notes (Signed)
Patient and her daughter requested assistance with bath and bed linen change due to episode of urinary incontinence. Patient had on a pad and mesh panties. Daughter requested that a female external catheter be placed on patient. Writer complied with daughter's request.

## 2021-11-29 NOTE — Evaluation (Signed)
Physical Therapy Evaluation  Patient Details Name: Penny Henry MRN: [id number] DOB: 01-03-1941 Today's Date: 11/29/2021  History of Present Illness  Pt is an 81 y/o female who presents 11/27/2021 after mechanical fall. She was found to have a displaced left hip femoral neck fracture and underwent a L THA (direct anterior approach) on 11/28/2021. PMH significant for anxiety, knee surgery.   Clinical Impression  Pt admitted with above diagnosis. Pt currently with functional limitations due to the deficits listed below (see PT Problem List). At the time of PT eval pt was able to perform transfers and ambulation with gross min guard to min assist and RW for support. Session limited by orthostatic hypotension. Official orthostatics were not taken, however pt appeared to be pre-syncopal in the hall during gait training and upon return to room BP was 89/56. Approximately 10 minutes later BP 92/60 and pt reports feeling good and is eating lunch sitting up in chair. Pt was left in the chair as she reports symptoms resolved and BP slowly increasing. RN notified. Anticipate pt will progress well from a functional standpoint. Acutely, pt will benefit from skilled PT to increase their independence and safety with mobility to allow discharge to the venue listed below.          Recommendations for follow up therapy are one component of a multi-disciplinary discharge planning process, led by the attending physician.  Recommendations may be updated based on patient status, additional functional criteria and insurance authorization.  Follow Up Recommendations Home health PT      Assistance Recommended at Discharge Frequent or constant Supervision/Assistance  Patient can return home with the following  A little help with walking and/or transfers;A little help with bathing/dressing/bathroom;Assistance with cooking/housework;Assist for transportation;Help with stairs or ramp for entrance    Equipment  Recommendations Rolling walker (2 wheels);BSC/3in1  Recommendations for Other Services       Functional Status Assessment Patient has had a recent decline in their functional status and demonstrates the ability to make significant improvements in function in a reasonable and predictable amount of time.     Precautions / Restrictions Precautions Precautions: Fall Precaution Comments: Direct anterior approach, no hip precautions Restrictions Weight Bearing Restrictions: No LLE Weight Bearing:  (Full weight bearing)      Mobility  Bed Mobility Overal bed mobility: Needs Assistance Bed Mobility: Supine to Sit     Supine to sit: Min assist     General bed mobility comments: Assist for LE advancement towards EOB. Pt reaching across for the railing and able to elevate trunk without assist. Increased time but pt was able to scoot out fully to EOB without assist.    Transfers Overall transfer level: Needs assistance Equipment used: Rolling walker (2 wheels) Transfers: Sit to/from Stand Sit to Stand: Min assist           General transfer comment: Assist for power up to full stand. VC's for hand placement on seated surface for safety.    Ambulation/Gait Ambulation/Gait assistance: Min guard Gait Distance (Feet): 100 Feet Assistive device: Rolling walker (2 wheels) Gait Pattern/deviations: Step-through pattern, Decreased stride length, Trunk flexed Gait velocity: Decreased Gait velocity interpretation: <1.31 ft/sec, indicative of household ambulator   General Gait Details: VC's for improved posture, closer walker proximity and sequencing with the RW for support. Pt became very lightheaded in hall, began sweating and slurring her speech. She appeared to be near syncope. Upon return to the room BP 89/56 and pt reports feeling much better.  Stairs  Wheelchair Mobility    Modified Rankin (Stroke Patients Only)       Balance Overall balance assessment: Needs  assistance Sitting-balance support: No upper extremity supported, Feet supported Sitting balance-Leahy Scale: Fair     Standing balance support: Bilateral upper extremity supported, Reliant on assistive device for balance, During functional activity Standing balance-Leahy Scale: Poor                               Pertinent Vitals/Pain Pain Assessment Pain Assessment: Faces Faces Pain Scale: Hurts little more Pain Location: L hip Pain Descriptors / Indicators: Operative site guarding, Sore Pain Intervention(s): Limited activity within patient's tolerance, Monitored during session, Repositioned    Home Living Family/patient expects to be discharged to:: Private residence Living Arrangements: Children Available Help at Discharge: Family;Available 24 hours/day Type of Home: House       Alternate Level Stairs-Number of Steps: flight Home Layout: Two level;Able to live on main level with bedroom/bathroom Home Equipment: None      Prior Function Prior Level of Function : Working/employed             Mobility Comments: Not using an AD PTA ADLs Comments: Pt reports difficulty getting into her tub shower and has been sponge bathing as a result. Daughter was taking her to the grocery store but recently pt has been staying home while daughter shops. Pt reports no assist required for dressing and can wash herself up at the sink without assist.     Hand Dominance        Extremity/Trunk Assessment   Upper Extremity Assessment Upper Extremity Assessment: LUE deficits/detail LUE Deficits / Details: Pt reports L shoulder pain and attributes this to her positioning in the OR. Does not believe it is from the fall despite same laterality as hip fx.    Lower Extremity Assessment Lower Extremity Assessment: LLE deficits/detail LLE Deficits / Details: Acute pain, decreased strength and AROM consistent with above mentioned injury and surgery.    Cervical / Trunk  Assessment Cervical / Trunk Assessment: Other exceptions Cervical / Trunk Exceptions: Forward head posture with rounded shoulders  Communication   Communication: No difficulties  Cognition Arousal/Alertness: Awake/alert Behavior During Therapy: WFL for tasks assessed/performed Overall Cognitive Status: Impaired/Different from baseline Area of Impairment: Memory                     Memory: Decreased short-term memory         General Comments: Pt repeating herself several times throughout the session        General Comments      Exercises General Exercises - Lower Extremity Long Arc Quad: 10 reps   Assessment/Plan    PT Assessment Patient needs continued PT services  PT Problem List Decreased strength;Decreased activity tolerance;Decreased balance;Decreased mobility;Decreased knowledge of use of DME;Decreased safety awareness;Decreased knowledge of precautions;Pain       PT Treatment Interventions DME instruction;Stair training;Gait training;Functional mobility training;Therapeutic activities;Therapeutic exercise;Balance training;Patient/family education    PT Goals (Current goals can be found in the Care Plan section)  Acute Rehab PT Goals Patient Stated Goal: Return home at d/c and not to SNF PT Goal Formulation: With patient/family Time For Goal Achievement: 12/06/21 Potential to Achieve Goals: Good    Frequency Min 5X/week     Co-evaluation               AM-PAC PT "6 Clicks" Mobility  Outcome Measure Help needed  turning from your back to your side while in a flat bed without using bedrails?: A Little Help needed moving from lying on your back to sitting on the side of a flat bed without using bedrails?: A Little Help needed moving to and from a bed to a chair (including a wheelchair)?: A Little Help needed standing up from a chair using your arms (e.g., wheelchair or bedside chair)?: A Little Help needed to walk in hospital room?: A Little Help  needed climbing 3-5 steps with a railing? : A Lot 6 Click Score: 17    End of Session Equipment Utilized During Treatment: Gait belt Activity Tolerance: Patient tolerated treatment well Patient left: in chair;with call bell/phone within reach;with chair alarm set;with family/visitor present Nurse Communication: Mobility status PT Visit Diagnosis: Unsteadiness on feet (R26.81);Pain Pain - Right/Left: Left Pain - part of body: Hip    Time: 6789-3810 PT Time Calculation (min) (ACUTE ONLY): 42 min   Charges:   PT Evaluation $PT Eval Moderate Complexity: 1 Mod PT Treatments $Gait Training: 23-37 mins        Penny Henry, PT, DPT Acute Rehabilitation Services Secure Chat Preferred Office: [phone]   Thelma Comp 11/29/2021, 3:42 PM

## 2021-11-30 DIAGNOSIS — S72002A Fracture of unspecified part of neck of left femur, initial encounter for closed fracture: Secondary | ICD-10-CM | POA: Diagnosis not present

## 2021-11-30 LAB — CBC
HCT: 24.5 % — ABNORMAL LOW (ref 36.0–46.0)
Hemoglobin: 8.2 g/dL — ABNORMAL LOW (ref 12.0–15.0)
MCH: 30.9 pg (ref 26.0–34.0)
MCHC: 33.5 g/dL (ref 30.0–36.0)
MCV: 92.5 fL (ref 80.0–100.0)
Platelets: 105 10*3/uL — ABNORMAL LOW (ref 150–400)
RBC: 2.65 MIL/uL — ABNORMAL LOW (ref 3.87–5.11)
RDW: 12.5 % (ref 11.5–15.5)
WBC: 8 10*3/uL (ref 4.0–10.5)
nRBC: 0 % (ref 0.0–0.2)

## 2021-11-30 LAB — BASIC METABOLIC PANEL
Anion gap: 4 — ABNORMAL LOW (ref 5–15)
BUN: 12 mg/dL (ref 8–23)
CO2: 27 mmol/L (ref 22–32)
Calcium: 8.2 mg/dL — ABNORMAL LOW (ref 8.9–10.3)
Chloride: 107 mmol/L (ref 98–111)
Creatinine, Ser: 0.7 mg/dL (ref 0.44–1.00)
GFR, Estimated: >60 mL/min (ref >60)
Glucose, Bld: 125 mg/dL — ABNORMAL HIGH (ref 70–99)
Potassium: 4.2 mmol/L (ref 3.5–5.1)
Sodium: 138 mmol/L (ref 135–145)

## 2021-11-30 NOTE — Progress Notes (Signed)
PROGRESS NOTE    Penny Henry  KCM:[id number] DOB: 08-12-1940 DOA: 11/27/2021 PCP: Buckner Malta, MD    Brief Narrative:  Penny Henry is a 81 y.o. female with medical history significant for generalized anxiety disorder, who is admitted to [facility] on 11/27/2021 by way of transfer from [facility] emergency department with acute left femoral neck fracture after presenting from home to the latter facility complaining of acute left hip pain.  S/p repair.     Assessment and Plan:  Acute left femoral neck fracture:  -confirmed via presenting plain films and stemming from ground level mechanical fall without associated loss of consciousness  --s/p repair PT/OT consult    Ground level mechanical fall: -PT/OT eval   Generalized anxiety disorder: On scheduled Wellbutrin and Lexapro as an outpatient.  Additionally, she is on scheduled daily Ativan at home as well.   anemia -expected post op blood loss -transfuse for <7 or today if still dizzy with PT   Orthostatic hypotension -IVF overnight -may need 1 unit PRBC   Constipation -bowel regimen   DVT prophylaxis: SCDs Start: 11/28/21 1441 SCDs Start: 11/28/21 0154    Code Status: Full Code Family Communication:   Disposition Plan:  Level of care: Med-Surg Status is: Inpatient Remains inpatient appropriate because: needs PT.OT eval    Consultants:  ortho   Subjective: No SOB, no CP  Objective: Vitals:   11/29/21 2147 11/30/21 0207 11/30/21 0500 11/30/21 0743  BP: 113/67 101/64  109/60  Pulse: (!) 124 (!) 106  (!) 108  Resp: 16 17  18   Temp: 98.4 F (36.9 C) 98.1 F (36.7 C)  97.9 F (36.6 C)  TempSrc: Oral Oral  Oral  SpO2: 94% 90%  93%  Weight:   74.6 kg   Height:        Intake/Output Summary (Last 24 hours) at 11/30/2021 1137 Last data filed at 11/30/2021 0531 Gross per 24 hour  Intake 1332.84 ml  Output 400 ml  Net 932.84 ml   Filed Weights   11/27/21 1917 11/28/21 0847  11/30/21 0500  Weight: 65.8 kg 65.8 kg 74.6 kg    Examination:   General: Appearance:     Overweight female in no acute distress     Lungs:      respirations unlabored  Heart:    Tachycardic.   MS:   All extremities are intact.   Neurologic:   Awake, alert     Data Reviewed: I have personally reviewed following labs and imaging studies  CBC: Recent Labs  Lab 11/27/21 2117 11/28/21 0339 11/29/21 0311 11/30/21 0349  WBC 11.1* 7.4 9.2 8.0  NEUTROABS 8.8* 5.7  --   --   HGB 13.2 11.9* 9.4* 8.2*  HCT 40.8 36.0 29.4* 24.5*  MCV 92.1 93.3 94.5 92.5  PLT 170 152 129* 105*   Basic Metabolic Panel: Recent Labs  Lab 11/27/21 2117 11/28/21 0339 11/30/21 0349  NA 138 138 138  K 4.2 4.6 4.2  CL 103 103 107  CO2 27 30 27   GLUCOSE 115* 139* 125*  BUN 23 20 12   CREATININE 0.92 0.92 0.70  CALCIUM 9.2 9.0 8.2*  MG  --  2.1  --    GFR: Estimated Creatinine Clearance: 57.2 mL/min (by C-G formula based on SCr of 0.7 mg/dL). Liver Function Tests: Recent Labs  Lab 11/28/21 0339  AST 18  ALT 23  ALKPHOS 64  BILITOT 0.7  PROT 5.8*  ALBUMIN 3.4*   No  results for input(s): "LIPASE", "AMYLASE" in the last 168 hours. No results for input(s): "AMMONIA" in the last 168 hours. Coagulation Profile: Recent Labs  Lab 11/27/21 2117  INR 1.0   Cardiac Enzymes: No results for input(s): "CKTOTAL", "CKMB", "CKMBINDEX", "TROPONINI" in the last 168 hours. BNP (last 3 results) No results for input(s): "PROBNP" in the last 8760 hours. HbA1C: No results for input(s): "HGBA1C" in the last 72 hours. CBG: No results for input(s): "GLUCAP" in the last 168 hours. Lipid Profile: No results for input(s): "CHOL", "HDL", "LDLCALC", "TRIG", "CHOLHDL", "LDLDIRECT" in the last 72 hours. Thyroid Function Tests: No results for input(s): "TSH", "T4TOTAL", "FREET4", "T3FREE", "THYROIDAB" in the last 72 hours. Anemia Panel: No results for input(s): "VITAMINB12", "FOLATE", "FERRITIN", "TIBC",  "IRON", "RETICCTPCT" in the last 72 hours. Sepsis Labs: No results for input(s): "PROCALCITON", "LATICACIDVEN" in the last 168 hours.  Recent Results (from the past 240 hour(s))  Surgical pcr screen     Status: None   Collection Time: 11/28/21  8:32 AM   Specimen: Nasal Mucosa; Nasal Swab  Result Value Ref Range Status   MRSA, PCR NEGATIVE NEGATIVE Final   Staphylococcus aureus NEGATIVE NEGATIVE Final    Comment: (NOTE) The Xpert SA Assay (FDA approved for NASAL specimens in patients 63 years of age and older), is one component of a comprehensive surveillance program. It is not intended to diagnose infection nor to guide or monitor treatment. Performed at [facility], [place] [address], [place], [place] [address]          Radiology Studies: DG Pelvis Portable  Result Date: 11/28/2021 CLINICAL DATA:  Postop total replacement of LEFT hip. EXAM: PORTABLE PELVIS 1-2 VIEWS COMPARISON:  Plain film of the LEFT femur dated 11/27/2021. FINDINGS: LEFT hip arthroplasty hardware is now in place. Hardware appears intact and appropriately positioned. Osseous alignment is anatomic. Expected postsurgical changes are seen within the overlying soft tissues. IMPRESSION: Status post LEFT hip arthroplasty. No evidence of surgical complicating feature. Electronically Signed   By: Franki Cabot M.D.   On: 11/28/2021 12:43   DG HIP UNILAT WITH PELVIS 1V LEFT  Result Date: 11/28/2021 CLINICAL DATA:  Left femoral neck fracture EXAM: DG HIP (WITH OR WITHOUT PELVIS) 1V*L* COMPARISON:  11/27/2021 FLUOROSCOPY TIME:  Radiation Exposure Index (as provided by the fluoroscopic device): 2.53 mGy If the device does not provide the exposure index: Fluoroscopy Time:  7.1 seconds Number of Acquired Images:  3 FINDINGS: Left hip prosthesis is now seen in satisfactory position. Calcified uterine fibroid is again noted. No other focal abnormality is seen. IMPRESSION: Status post left hip replacement Electronically Signed    By: Inez Catalina M.D.   On: 11/28/2021 11:53   DG C-Arm 1-60 Min-No Report  Result Date: 11/28/2021 Fluoroscopy was utilized by the requesting physician.  No radiographic interpretation.        Scheduled Meds:  aspirin  81 mg Oral BID   buPROPion ER  100 mg Oral Daily   docusate sodium  100 mg Oral BID   escitalopram  10 mg Oral Daily   magnesium oxide  400 mg Oral BID   pantoprazole  40 mg Oral Daily   PreserVision AREDS 2  2 capsule Oral Daily   Continuous Infusions:  methocarbamol (ROBAXIN) IV       LOS: 2 days    Time spent: 45 minutes spent on chart review, discussion with nursing staff, consultants, updating family and interview/physical exam; more than 50% of that time was spent  in counseling and/or coordination of care.    Joseph Art, DO [facility] Available via Epic secure chat 7am-7pm After these hours, please refer to coverage provider listed on [email or web address] 11/30/2021, 11:37 AM

## 2021-11-30 NOTE — Progress Notes (Signed)
Physical Therapy Treatment Patient Details Name: Penny Henry MRN: [id number] DOB: 10-Apr-1940 Today's Date: 11/30/2021   History of Present Illness Pt is an 81 y/o female who presents 11/27/2021 after mechanical fall. She was found to have a displaced left hip femoral neck fracture and underwent a L THA (direct anterior approach) on 11/28/2021. Orthostatic hypotension during PT session 11/3 but BP stable on 11/4. PMH significant for anxiety, knee surgery.    PT Comments    Pt received in recliner, reporting minimal pain at rest and with mobility, eager to participate in functional mobility activities. Pt BP checked and orthostatics stable, pt without symptoms of orthostatic hypotension during gait and stair negotiation, chair follow provided for safety during session but pt did not need seated break. Pt needing up to min guard for transfers/stairs but mostly Supervision during gait trial with RW support for directional cues, pt can be tangential. Pt HR slightly elevated to ~120 bpm with transfers/standing and ~110 bpm resting. Anticipate pt will be safe for DC home from a mobility standpoint once medically cleared, will continue to follow acutely for safety instruction with transfers/gait and stairs. Pt continues to benefit from PT services to progress toward functional mobility goals.   Recommendations for follow up therapy are one component of a multi-disciplinary discharge planning process, led by the attending physician.  Recommendations may be updated based on patient status, additional functional criteria and insurance authorization.  Follow Up Recommendations  Home health PT     Assistance Recommended at Discharge Frequent or constant Supervision/Assistance  Patient can return home with the following A little help with walking and/or transfers;A little help with bathing/dressing/bathroom;Assistance with cooking/housework;Assist for transportation;Help with stairs or ramp for entrance    Equipment Recommendations  Rolling walker (2 wheels);BSC/3in1    Recommendations for Other Services       Precautions / Restrictions Precautions Precautions: Fall Precaution Comments: Direct anterior approach, no hip precautions Restrictions Weight Bearing Restrictions: No LLE Weight Bearing:  (Full weight bearing)     Mobility  Bed Mobility Overal bed mobility: Needs Assistance             General bed mobility comments: pt received up in chair    Transfers Overall transfer level: Needs assistance Equipment used: Rolling walker (2 wheels) Transfers: Sit to/from Stand Sit to Stand: Min guard           General transfer comment: tech standing by but not needing to assist for power up. cues for safe UE placement needed    Ambulation/Gait Ambulation/Gait assistance: Supervision Gait Distance (Feet): 250 Feet Assistive device: Rolling walker (2 wheels) Gait Pattern/deviations: Step-through pattern, Decreased stride length Gait velocity: Decreased Gait velocity interpretation: <1.31 ft/sec, indicative of household ambulator   General Gait Details: Pt needs occasional reminders for proximity to RW and posture, but overall fair RW use; chair follow for safety given previous date symptoms however no lightheadedness this date and pt speech and thought processes clear during gait trial. Pt reports only minimal pain; needs directional/navigational cues and pt passing her hospital room upon return needs reminder for attention to room numbers.   Stairs Stairs: Yes Stairs assistance: Min guard Stair Management: Two rails, Forwards, Step to pattern Number of Stairs: 4 General stair comments: cues for step-to pattern for decreased pain and sequencing, pt with poor carryover after initial cues needs a reminder for each step (Pt has 2 STE her kitchen)  Wheelchair Mobility    Modified Rankin (Stroke Patients Only)  Balance Overall balance assessment: Needs  assistance Sitting-balance support: No upper extremity supported, Feet supported Sitting balance-Leahy Scale: Good     Standing balance support: Bilateral upper extremity supported, Reliant on assistive device for balance, During functional activity Standing balance-Leahy Scale: Good Standing balance comment: static standing 0-1 UE support no LOB; BUE support for gait good with RW                            Cognition Arousal/Alertness: Awake/alert Behavior During Therapy: WFL for tasks assessed/performed Overall Cognitive Status: Impaired/Different from baseline                                 General Comments: Improving recall of current and previous session events, however pt at times distractable and needs reorientation to task.        Exercises General Exercises - Lower Extremity Ankle Circles/Pumps: AROM, Both, 10 reps, Supine Long Arc Quad: Both, 5 reps, AROM Heel Slides: AROM, Both, 5 reps, Supine Hip ABduction/ADduction: AROM, 5 reps, Left, Supine    General Comments General comments (skin integrity, edema, etc.): supine/seated/standing orthostatics taken and stable, RN notified; HR slightly elevated ~120 bpm with exertion, pt reports this has been her baseline during past day      Pertinent Vitals/Pain Pain Assessment Pain Assessment: Faces Faces Pain Scale: Hurts little more Pain Location: L hip Pain Descriptors / Indicators: Operative site guarding, Sore Pain Intervention(s): Monitored during session, Repositioned, Ice applied     PT Goals (current goals can now be found in the care plan section) Acute Rehab PT Goals Patient Stated Goal: to go home PT Goal Formulation: With patient/family Time For Goal Achievement: 12/06/21 Progress towards PT goals: Progressing toward goals    Frequency    Min 5X/week      PT Plan Current plan remains appropriate       AM-PAC PT "6 Clicks" Mobility   Outcome Measure  Help needed  turning from your back to your side while in a flat bed without using bedrails?: None Help needed moving from lying on your back to sitting on the side of a flat bed without using bedrails?: A Little Help needed moving to and from a bed to a chair (including a wheelchair)?: A Little Help needed standing up from a chair using your arms (e.g., wheelchair or bedside chair)?: A Little Help needed to walk in hospital room?: A Little Help needed climbing 3-5 steps with a railing? : A Little 6 Click Score: 19    End of Session Equipment Utilized During Treatment: Gait belt Activity Tolerance: Patient tolerated treatment well Patient left: in chair;with call bell/phone within reach;with family/visitor present Nurse Communication: Mobility status;Other (comment) (stable BP) PT Visit Diagnosis: Unsteadiness on feet (R26.81);Pain Pain - Right/Left: Left Pain - part of body: Hip     Time: 2841-3244 PT Time Calculation (min) (ACUTE ONLY): 26 min  Charges:  $Gait Training: 8-22 mins $Therapeutic Activity: 8-22 mins                     Giannah Zavadil P., PTA Acute Rehabilitation Services Secure Chat Preferred 9a-5:30pm Office: [place] 11/30/2021, 3:20 PM

## 2021-11-30 NOTE — Plan of Care (Signed)
  Problem: Education: Goal: Knowledge of General Education information will improve Description: Including pain rating scale, medication(s)/side effects and non-pharmacologic comfort measures Outcome: Progressing   Problem: Health Behavior/Discharge Planning: Goal: Ability to manage health-related needs will improve Outcome: Progressing   Problem: Clinical Measurements: Goal: Diagnostic test results will improve Outcome: Progressing   Problem: Activity: Goal: Risk for activity intolerance will decrease Outcome: Progressing   Problem: Nutrition: Goal: Adequate nutrition will be maintained Outcome: Progressing   Problem: Pain Managment: Goal: General experience of comfort will improve Outcome: Progressing   Problem: Education: Goal: Knowledge of the prescribed therapeutic regimen will improve Outcome: Progressing   Problem: Pain Management: Goal: Pain level will decrease with appropriate interventions Outcome: Progressing

## 2021-11-30 NOTE — Progress Notes (Signed)
Patient ID: Penny Henry, female   DOB: 11/20/1940, 81 y.o.   MRN: [id number] Patient is status post total hip arthroplasty for femoral neck fracture on the left.  Patient states she did have a rash postoperatively examination today shows the rash to be resolved.  There is no cellulitis.  Discharge planning based on therapy recommendations.

## 2021-11-30 NOTE — Progress Notes (Signed)
Transition of Care [facility]) - CAGE-AID Screening   Patient Details  Name: Penny Henry MRN: [id number] Date of Birth: 10-Mar-1940  Elvina Sidle, RN Trauma Response Nurse Phone Number: [phone] 11/30/2021, 4:17 PM    CAGE-AID Screening:    Have You Ever Felt You Ought to Cut Down on Your Drinking or Drug Use?: No Have People Annoyed You By [employer] Your Drinking Or Drug Use?: No Have You Felt Bad Or Guilty About Your Drinking Or Drug Use?: No Have You Ever Had a Drink or Used Drugs First Thing In The Morning to Steady Your Nerves or to Get Rid of a Hangover?: No CAGE-AID Score: 0  Substance Abuse Education Offered: No

## 2021-12-01 DIAGNOSIS — S72002A Fracture of unspecified part of neck of left femur, initial encounter for closed fracture: Secondary | ICD-10-CM | POA: Diagnosis not present

## 2021-12-01 LAB — CBC
HCT: 25.8 % — ABNORMAL LOW (ref 36.0–46.0)
Hemoglobin: 8.1 g/dL — ABNORMAL LOW (ref 12.0–15.0)
MCH: 30.1 pg (ref 26.0–34.0)
MCHC: 31.4 g/dL (ref 30.0–36.0)
MCV: 95.9 fL (ref 80.0–100.0)
Platelets: 130 10*3/uL — ABNORMAL LOW (ref 150–400)
RBC: 2.69 MIL/uL — ABNORMAL LOW (ref 3.87–5.11)
RDW: 12.4 % (ref 11.5–15.5)
WBC: 6.4 10*3/uL (ref 4.0–10.5)
nRBC: 0 % (ref 0.0–0.2)

## 2021-12-01 NOTE — Progress Notes (Signed)
Pt's HR was slightly elevated today 109-114. Pt states she is not in pain. Eulogio Bear, DO is notified. No new orders.

## 2021-12-01 NOTE — Plan of Care (Signed)
  Problem: Activity: Goal: Risk for activity intolerance will decrease Outcome: Progressing   Problem: Nutrition: Goal: Adequate nutrition will be maintained Outcome: Progressing   Problem: Pain Managment: Goal: General experience of comfort will improve Outcome: Progressing   

## 2021-12-01 NOTE — Progress Notes (Signed)
Physical Therapy Treatment Patient Details Name: Penny Henry MRN: [id number] DOB: 1940/03/27 Today's Date: 12/01/2021   History of Present Illness Pt is an 81 y/o female who presents 11/27/2021 after mechanical fall. She was found to have a displaced left hip femoral neck fracture and underwent a L THA (direct anterior approach) on 11/28/2021. Orthostatic hypotension during PT session 11/3 but BP stable on 11/4. PMH significant for anxiety, knee surgery.    PT Comments    Pt moving well today, no issues with dizziness or hypotension. Pt moving LLE against gravity in bed and able to perform LE there ex with manual resistance. Pt ambulated 250' with RW and supervision. Ready for d/c home with HHPT from a PT standpoint.     Recommendations for follow up therapy are one component of a multi-disciplinary discharge planning process, led by the attending physician.  Recommendations may be updated based on patient status, additional functional criteria and insurance authorization.  Follow Up Recommendations  Home health PT     Assistance Recommended at Discharge Frequent or constant Supervision/Assistance  Patient can return home with the following A little help with walking and/or transfers;A little help with bathing/dressing/bathroom;Assistance with cooking/housework;Assist for transportation;Help with stairs or ramp for entrance   Equipment Recommendations  Rolling walker (2 wheels);BSC/3in1    Recommendations for Other Services       Precautions / Restrictions Precautions Precautions: Fall Precaution Comments: Direct anterior approach, no hip precautions Restrictions Weight Bearing Restrictions: Yes LLE Weight Bearing: Weight bearing as tolerated     Mobility  Bed Mobility Overal bed mobility: Modified Independent Bed Mobility: Supine to Sit, Sit to Supine     Supine to sit: Modified independent (Device/Increase time) Sit to supine: Modified independent (Device/Increase  time)   General bed mobility comments: pt getting in and out of bed indepently    Transfers Overall transfer level: Needs assistance Equipment used: Rolling walker (2 wheels) Transfers: Sit to/from Stand Sit to Stand: Supervision           General transfer comment: no assist needed, proper hand placement maintained    Ambulation/Gait Ambulation/Gait assistance: Supervision Gait Distance (Feet): 250 Feet Assistive device: Rolling walker (2 wheels) Gait Pattern/deviations: Step-through pattern, Decreased stride length Gait velocity: Decreased Gait velocity interpretation: <1.8 ft/sec, indicate of risk for recurrent falls   General Gait Details: pt initially with trunk flexed, raised RW one notch and much improved.   Stairs             Wheelchair Mobility    Modified Rankin (Stroke Patients Only)       Balance Overall balance assessment: Needs assistance Sitting-balance support: No upper extremity supported, Feet supported Sitting balance-Leahy Scale: Good     Standing balance support: Bilateral upper extremity supported, Reliant on assistive device for balance, During functional activity Standing balance-Leahy Scale: Fair Standing balance comment: static standing 0-1 UE support no LOB; BUE support for gait good with RW                            Cognition Arousal/Alertness: Awake/alert Behavior During Therapy: WFL for tasks assessed/performed Overall Cognitive Status: Within Functional Limits for tasks assessed                       Memory: Decreased short-term memory         General Comments: cognition contnues to improve, appropriate throughout session today and remembering instructions from previous sessions  Exercises General Exercises - Lower Extremity Long Arc Quad: AROM, Left, 10 reps, Other (comment) (with resistance) Hip ABduction/ADduction: AROM, 10 reps, Seated, Other (comment), Both, Left, Supine (with  resistance) Straight Leg Raises: AROM, Left, 10 reps, Supine    General Comments General comments (skin integrity, edema, etc.): VSS. Son present      Pertinent Vitals/Pain Pain Assessment Pain Assessment: Faces Faces Pain Scale: Hurts a little bit Pain Location: L hip Pain Descriptors / Indicators: Operative site guarding, Sore Pain Intervention(s): Monitored during session    Home Living                          Prior Function            PT Goals (current goals can now be found in the care plan section) Acute Rehab PT Goals Patient Stated Goal: to go home PT Goal Formulation: With patient/family Time For Goal Achievement: 12/06/21 Potential to Achieve Goals: Good Progress towards PT goals: Progressing toward goals    Frequency    Min 5X/week      PT Plan Current plan remains appropriate    Co-evaluation              AM-PAC PT "6 Clicks" Mobility   Outcome Measure  Help needed turning from your back to your side while in a flat bed without using bedrails?: None Help needed moving from lying on your back to sitting on the side of a flat bed without using bedrails?: None Help needed moving to and from a bed to a chair (including a wheelchair)?: A Little Help needed standing up from a chair using your arms (e.g., wheelchair or bedside chair)?: A Little Help needed to walk in hospital room?: A Little Help needed climbing 3-5 steps with a railing? : A Little 6 Click Score: 20    End of Session Equipment Utilized During Treatment: Gait belt Activity Tolerance: Patient tolerated treatment well Patient left: with call bell/phone within reach;with family/visitor present;in bed Nurse Communication: Mobility status PT Visit Diagnosis: Unsteadiness on feet (R26.81);Pain Pain - Right/Left: Left Pain - part of body: Hip     Time: 1153-1220 PT Time Calculation (min) (ACUTE ONLY): 27 min  Charges:  $Gait Training: 8-22 mins $Therapeutic Exercise:  8-22 mins                     Leighton Roach, PT  Acute Rehab Services Secure chat preferred Office [place] 12/01/2021, 12:50 PM

## 2021-12-01 NOTE — Progress Notes (Signed)
    Durable Medical Equipment  (From admission, onward)           Start     Ordered   12/01/21 1613  For home use only DME Walker rolling  Once       Question Answer Comment  Walker: With [place] Wheels   Patient needs a walker to treat with the following condition Gait instability      12/01/21 1612   12/01/21 1612  For home use only DME Bedside commode  Once       Question:  Patient needs a bedside commode to treat with the following condition  Answer:  Gait instability   12/01/21 1612   11/28/21 1441  DME 3 n 1  Once        11/28/21 1440   11/28/21 1441  DME Walker rolling  Once       Question Answer Comment  Walker: With 5 Inch Wheels   Patient needs a walker to treat with the following condition Status post total replacement of left hip      11/28/21 1440

## 2021-12-01 NOTE — Progress Notes (Signed)
Patient ID: Penny Henry, female   DOB: 10-Apr-1940, 81 y.o.   MRN: [id number] Patient is status post total hip arthroplasty for femoral neck fracture.  She is progressing well with therapy.  Anticipate discharge to home on Monday.

## 2021-12-01 NOTE — TOC Initial Note (Signed)
Transition of Care [facility]) - Initial/Assessment Note    Patient Details  Name: Penny Henry MRN: [id number] Date of Birth: 1940-11-15  Transition [facility]) CM/SW Contact:    Tom-Johnson, Hershal Coria, RN Phone Number: 12/01/2021, 4:04 PM  Clinical Narrative:                  CM spoke with patient at bedside about needs for post hospital transition. Admitted for Displaced Left hip Femoral Neck Fracture and had Anterior Total Hip Arthroplasty on 11/28/21.  From home alone, has two supportive children. Independent with care and drive self prior to admission. Does not have DME at home. RW and BSC ordered from [employer] and [facility] to deliver at bedside.  PCP is Buckner Malta, MD and uses [facility] for her prescriptions.  Home health referral called in to [facility] per patient's request from Medicare.gov list and Tresa Endo voiced acceptance, info on AVS.   Daughter to transport at discharge. CM will continue to follow as patient progresses with care towards discharge.    Expected Discharge Plan: Home w Home Health Services Barriers to Discharge: Continued Medical Work up   Patient Goals and CMS Choice Patient states their goals for this hospitalization and ongoing recovery are:: To return home CMS Medicare.gov Compare Post Acute Care list provided to:: Patient Choice offered to / list presented to : Patient  Expected Discharge Plan and Services Expected Discharge Plan: Home w Home Health Services   Discharge Planning Services: CM Consult Post Acute Care Choice: Home Health Living arrangements for the past 2 months: Single Family Home                 DME Arranged: Bedside commode, Walker rolling DME Agency: [facility] Date DME Agency Contacted: 12/01/21 Time DME Agency Contacted: 1550 Representative spoke with at DME Agency: [facility] HH Arranged: PT HH Agency: [facility] Date [facility] Agency Contacted: 12/01/21 Time HH Agency Contacted: 1545 Representative spoke  with at [facility] Agency: Tresa Endo  Prior Living Arrangements/Services Living arrangements for the past 2 months: Single Family Home Lives with:: Self Patient language and need for interpreter reviewed:: Yes Do you feel safe going back to the place where you live?: Yes      Need for Family Participation in Patient Care: Yes (Comment) Care giver support system in place?: Yes (comment) Current home services: DME Criminal Activity/Legal Involvement Pertinent to Current Situation/Hospitalization: No - Comment as needed  Activities of Daily Living Home Assistive Devices/Equipment: None ADL Screening (condition at time of admission) Patient's cognitive ability adequate to safely complete daily activities?: Yes Is the patient deaf or have difficulty hearing?: No Does the patient have difficulty seeing, even when wearing glasses/contacts?: No Does the patient have difficulty concentrating, remembering, or making decisions?: No Patient able to express need for assistance with ADLs?: Yes Does the patient have difficulty dressing or bathing?: Yes Independently performs ADLs?: No Communication: Independent Dressing (OT): Needs assistance Feeding: Independent Bathing: Dependent Does the patient have difficulty walking or climbing stairs?: No Weakness of Legs: Left Weakness of Arms/Hands: None  Permission Sought/Granted Permission sought to share information with : Case Manager, Magazine features editor, Family Supports Permission granted to share information with : Yes, Verbal Permission Granted              Emotional Assessment Appearance:: Appears stated age Attitude/Demeanor/Rapport: Engaged, Gracious Affect (typically observed): Accepting, Appropriate, Calm, Hopeful, Pleasant Orientation: : Oriented to Self, Oriented to Place, Oriented to  Time, Oriented to Situation Alcohol /  Substance Use: Not Applicable Psych Involvement: No (comment)  Admission diagnosis:  Fracture of femoral  neck, left ([place]) [S72.002A] Closed fracture of left hip, initial encounter [facility]) [S72.002A] Patient Active Problem List   Diagnosis Date Noted   Fall at home, initial encounter 11/28/2021   Leukocytosis 11/28/2021   GAD (generalized anxiety disorder) 11/28/2021   Closed displaced fracture of neck of left femur ([place]) 11/27/2021   Nontraumatic complete tear of left rotator cuff 12/08/2019   Chronic left shoulder pain 12/08/2019   Chronic pain of left knee 10/14/2017   Unilateral primary osteoarthritis, left knee 09/16/2017   Stasis dermatitis 09/16/2017   Allergic rhinitis 09/08/2017   PVC (premature ventricular contraction) 08/03/2014   SVT (supraventricular tachycardia) 08/03/2014   PCP:  Serita Grammes, MD Pharmacy:   [facility], [place] [address] Phone: [phone] Fax: [phone]     Social Determinants of Health (SDOH) Interventions    Readmission Risk Interventions     No data to display

## 2021-12-01 NOTE — Progress Notes (Signed)
Mobility Specialist Progress Note   12/01/21 1350  Mobility  Activity Refused mobility   Patient just returned to bed and requested to return later. Will f/u as time permits.  Penny Henry, [place], [place]  Office: [phone]

## 2021-12-01 NOTE — Progress Notes (Signed)
PROGRESS NOTE    Penny Henry  HQI:[id number] DOB: October 04, 1940 DOA: 11/27/2021 PCP: Serita Grammes, MD    Brief Narrative:  Penny Henry is a 81 y.o. female with medical history significant for generalized anxiety disorder, who is admitted to [facility] on 11/27/2021 by way of transfer from [place] emergency department with acute left femoral neck fracture after presenting from home to the latter facility complaining of acute left hip pain.  S/p repair.  Most likely will go home in AM.   Assessment and Plan:  Acute left femoral neck fracture:  -confirmed via presenting plain films and stemming from ground level mechanical fall without associated loss of consciousness  --s/p repair PT/OT consult    Ground level mechanical fall: -PT/OT eval   Generalized anxiety disorder: On scheduled Wellbutrin and Lexapro as an outpatient.  Additionally, she is on scheduled daily Ativan at home as well.   anemia -expected post op blood loss -Hgb seems to have stabilized-- will hold on PRBC for now   Orthostatic hypotension -improved with IVF   Constipation -bowel regimen   DVT prophylaxis: SCDs Start: 11/28/21 1441 SCDs Start: 11/28/21 0154    Code Status: Full Code Family Communication:   Disposition Plan:  Level of care: Med-Surg Status is: Inpatient Remains inpatient appropriate because: home in AM?    Consultants:  ortho   Subjective: No further dizziness  Objective: Vitals:   11/30/21 1953 12/01/21 0433 12/01/21 0500 12/01/21 0741  BP: 117/66 106/83  125/73  Pulse: (!) 124 (!) 138  (!) 109  Resp: 17 16  16   Temp: 99.7 F (37.6 C) 98.5 F (36.9 C)  (!) 97.4 F (36.3 C)  TempSrc: Oral Oral  Oral  SpO2: 97%   95%  Weight:   73 kg   Height:        Intake/Output Summary (Last 24 hours) at 12/01/2021 1220 Last data filed at 12/01/2021 1117 Gross per 24 hour  Intake 240 ml  Output 1 ml  Net 239 ml   Filed Weights   11/28/21 0847  11/30/21 0500 12/01/21 0500  Weight: 65.8 kg 74.6 kg 73 kg    Examination:    General: Appearance:     Overweight female in no acute distress     Lungs:     respirations unlabored  Heart:    Tachycardic. Normal rhythm. No murmurs, rubs, or gallops.   MS:   All extremities are intact.   Neurologic:   Awake, alert       Data Reviewed: I have personally reviewed following labs and imaging studies  CBC: Recent Labs  Lab 11/27/21 2117 11/28/21 0339 11/29/21 0311 11/30/21 0349 12/01/21 0917  WBC 11.1* 7.4 9.2 8.0 6.4  NEUTROABS 8.8* 5.7  --   --   --   HGB 13.2 11.9* 9.4* 8.2* 8.1*  HCT 40.8 36.0 29.4* 24.5* 25.8*  MCV 92.1 93.3 94.5 92.5 95.9  PLT 170 152 129* 105* 132*   Basic Metabolic Panel: Recent Labs  Lab 11/27/21 2117 11/28/21 0339 11/30/21 0349  NA 138 138 138  K 4.2 4.6 4.2  CL 103 103 107  CO2 27 30 27   GLUCOSE 115* 139* 125*  BUN 23 20 12   CREATININE 0.92 0.92 0.70  CALCIUM 9.2 9.0 8.2*  MG  --  2.1  --    GFR: Estimated Creatinine Clearance: 52.7 mL/min (by C-G formula based on SCr of 0.7 mg/dL). Liver Function Tests: Recent Labs  Lab 11/28/21  0339  AST 18  ALT 23  ALKPHOS 64  BILITOT 0.7  PROT 5.8*  ALBUMIN 3.4*   No results for input(s): "LIPASE", "AMYLASE" in the last 168 hours. No results for input(s): "AMMONIA" in the last 168 hours. Coagulation Profile: Recent Labs  Lab 11/27/21 2117  INR 1.0   Cardiac Enzymes: No results for input(s): "CKTOTAL", "CKMB", "CKMBINDEX", "TROPONINI" in the last 168 hours. BNP (last 3 results) No results for input(s): "PROBNP" in the last 8760 hours. HbA1C: No results for input(s): "HGBA1C" in the last 72 hours. CBG: No results for input(s): "GLUCAP" in the last 168 hours. Lipid Profile: No results for input(s): "CHOL", "HDL", "LDLCALC", "TRIG", "CHOLHDL", "LDLDIRECT" in the last 72 hours. Thyroid Function Tests: No results for input(s): "TSH", "T4TOTAL", "FREET4", "T3FREE", "THYROIDAB" in the  last 72 hours. Anemia Panel: No results for input(s): "VITAMINB12", "FOLATE", "FERRITIN", "TIBC", "IRON", "RETICCTPCT" in the last 72 hours. Sepsis Labs: No results for input(s): "PROCALCITON", "LATICACIDVEN" in the last 168 hours.  Recent Results (from the past 240 hour(s))  Surgical pcr screen     Status: None   Collection Time: 11/28/21  8:32 AM   Specimen: Nasal Mucosa; Nasal Swab  Result Value Ref Range Status   MRSA, PCR NEGATIVE NEGATIVE Final   Staphylococcus aureus NEGATIVE NEGATIVE Final    Comment: (NOTE) The Xpert SA Assay (FDA approved for NASAL specimens in patients 48 years of age and older), is one component of a comprehensive surveillance program. It is not intended to diagnose infection nor to guide or monitor treatment. Performed at [facility], [address] [place], [place] [address]          Radiology Studies: No results found.      Scheduled Meds:  aspirin  81 mg Oral BID   buPROPion ER  100 mg Oral Daily   docusate sodium  100 mg Oral BID   escitalopram  10 mg Oral Daily   magnesium oxide  400 mg Oral BID   pantoprazole  40 mg Oral Daily   PreserVision AREDS 2  2 capsule Oral Daily   Continuous Infusions:  methocarbamol (ROBAXIN) IV       LOS: 3 days    Time spent: 45 minutes spent on chart review, discussion with nursing staff, consultants, updating family and interview/physical exam; more than 50% of that time was spent in counseling and/or coordination of care.    Joseph Art, DO [facility] Available via Epic secure chat 7am-7pm After these hours, please refer to coverage provider listed on [email or web address] 12/01/2021, 12:20 PM

## 2021-12-02 DIAGNOSIS — S72002A Fracture of unspecified part of neck of left femur, initial encounter for closed fracture: Secondary | ICD-10-CM | POA: Diagnosis not present

## 2021-12-02 LAB — CBC
HCT: 23.7 % — ABNORMAL LOW (ref 36.0–46.0)
Hemoglobin: 7.8 g/dL — ABNORMAL LOW (ref 12.0–15.0)
MCH: 30.5 pg (ref 26.0–34.0)
MCHC: 32.9 g/dL (ref 30.0–36.0)
MCV: 92.6 fL (ref 80.0–100.0)
Platelets: 154 10*3/uL (ref 150–400)
RBC: 2.56 MIL/uL — ABNORMAL LOW (ref 3.87–5.11)
RDW: 12.3 % (ref 11.5–15.5)
WBC: 7 10*3/uL (ref 4.0–10.5)
nRBC: 0 % (ref 0.0–0.2)

## 2021-12-02 LAB — BASIC METABOLIC PANEL
Anion gap: 7 (ref 5–15)
BUN: 10 mg/dL (ref 8–23)
CO2: 26 mmol/L (ref 22–32)
Calcium: 8.4 mg/dL — ABNORMAL LOW (ref 8.9–10.3)
Chloride: 107 mmol/L (ref 98–111)
Creatinine, Ser: 0.67 mg/dL (ref 0.44–1.00)
GFR, Estimated: >60 mL/min (ref >60)
Glucose, Bld: 106 mg/dL — ABNORMAL HIGH (ref 70–99)
Potassium: 4 mmol/L (ref 3.5–5.1)
Sodium: 140 mmol/L (ref 135–145)

## 2021-12-02 MED ORDER — POLYSACCHARIDE IRON COMPLEX 150 MG PO CAPS: 150.0000 mg | ORAL_CAPSULE | Freq: Every day | ORAL | 0 refills | Status: DC

## 2021-12-02 MED ORDER — ASPIRIN 81 MG PO CHEW: 81.0000 mg | CHEWABLE_TABLET | Freq: Two times a day (BID) | ORAL | 0 refills | Status: DC

## 2021-12-02 MED ORDER — OXYCODONE HCL 5 MG PO TABS: 5.0000 mg | ORAL_TABLET | Freq: Four times a day (QID) | ORAL | 0 refills | Status: DC | PRN

## 2021-12-02 MED ORDER — POLYSACCHARIDE IRON COMPLEX 150 MG PO CAPS
150.0000 mg | ORAL_CAPSULE | Freq: Every day | ORAL | Status: DC
  Administered 2021-12-02: 150 mg via ORAL
  Filled 2021-12-02 (×2): qty 1

## 2021-12-02 MED ORDER — POLYETHYLENE GLYCOL 3350 17 G PO PACK: 17.0000 g | PACK | Freq: Every day | ORAL | 0 refills | Status: AC | PRN

## 2021-12-02 NOTE — Progress Notes (Signed)
Patient is A&O x4, denies any pain or discomfort, VS and WNR, tolerated foods and fluids fine, she is D/C to go home with home health in place, her daughter will provide the transport home.

## 2021-12-02 NOTE — Care Management Important Message (Signed)
Important Message  Patient Details  Name: Penny Henry MRN: [id number] Date of Birth: February 07, 1940   Medicare Important Message Given:  Yes     Hannah Beat 12/02/2021, 12:36 PM

## 2021-12-02 NOTE — Progress Notes (Signed)
Patient ID: Penny Henry, female   DOB: 1940-08-30, 82 y.o.   MRN: [id number] The patient is awake and alert this morning with her daughter at the bedside.  She has done well from a mobility and therapy standpoint.  Her vital signs are stable.  She denies any lightheadedness.  Her hemoglobin is down to 7.8.  I did change her left hip dressing at the bedside and it only had scant bloody drainage on it.  From orthopedic standpoint, she can be discharged home if she is deemed medically safe and stable.  If she is truly asymptomatic from the current acute blood loss anemia, we will be fine to just monitor this.  I will send in pain medications to her pharmacy as well as a baby aspirin twice daily.

## 2021-12-02 NOTE — Plan of Care (Signed)
0700: Pt resting in bed at beginning of shift, pt alert and oriented x4, pt call bell within each and bed in lowest position.   1000: Discharge orders in place, pt going home with BSC, and walker

## 2021-12-02 NOTE — Plan of Care (Signed)
  Problem: Health Behavior/Discharge Planning: Goal: Ability to manage health-related needs will improve Outcome: Progressing   Problem: Clinical Measurements: Goal: Diagnostic test results will improve Outcome: Progressing   Problem: Activity: Goal: Risk for activity intolerance will decrease Outcome: Progressing   Problem: Nutrition: Goal: Adequate nutrition will be maintained Outcome: Progressing   Problem: Coping: Goal: Level of anxiety will decrease Outcome: Progressing   

## 2021-12-02 NOTE — Discharge Summary (Addendum)
Physician Discharge Summary  Penny Henry [id number] DOB: Apr 23, 1940 DOA: 11/27/2021  PCP: Serita Grammes, MD  Admit date: 11/27/2021 Discharge date: 12/02/2021  Admitted From: home Discharge disposition: home   Recommendations for Outpatient Follow-Up:   Home health Cbc 10 days aSA BID per ortho   Discharge Diagnosis:   Principal Problem:   Closed displaced fracture of neck of left femur [facility]) Active Problems:   Fall at home, initial encounter   Leukocytosis   GAD (generalized anxiety disorder)    Discharge Condition: Improved.  Diet recommendation:   Regular.  Wound care: None.  Code status: Full.   History of Present Illness:   Penny Henry is a 81 y.o. female with medical history significant for generalized anxiety disorder, who is admitted to [facility] on 11/27/2021 by way of transfer from [place] emergency department with acute left femoral neck fracture after presenting from home to the latter facility complaining of acute left hip pain.   Patient reports tripping while attempting to ambulate at home, resulting in a ground level fall during which left hip was the principal point of contact with the floor below. As a result of this fall, the patient reports immediate development of sharp left hip pain, with radiation into the  left   groin. States that this pain has been constant since onset, with exacerbation when attempting to move the left  lower extremity.  As a consequence of the associated intensity of this discomfort, reports that she is unable to bear weight on the left  lower extremity at this time.  This is relative to baseline functional status in which the patient lives independently as well as baseline ambulatory status in which the patient reports the ability to ambulate without assistance, including no need for support devices.  Otherwise, denies any acute arthralgias or myalgias as a result of the above fall.   Denies any acute numbness or paresthesias in bilateral lower extremities, and confirms that left  hip representations a native joint.   Did not hit head as a component of this fall, and denies any associated loss of consciousness.  Denies any preceding or associated chest pain, shortness of breath, diaphoresis, palpitations, nausea, vomiting, dizziness, presyncope, or syncope.  Denies any subsequent headache, neck pain, blurry vision, or diplopia.  On a daily baby aspirin as an outpatient, but otherwise, denies any additional blood thinners at home.  Most recent baby aspirin taken on the morning of 11/27/2021.  denies any recent orthopnea, PND, or peripheral edema.    Of note, the patient is established with Dr. Zollie Beckers of [facility] as an outpatient.    Hospital Course by Problem:   Acute left femoral neck fracture:  -confirmed via presenting plain films and stemming from ground level mechanical fall without associated loss of consciousness  --s/p repair PT/OT consult     Ground level mechanical fall: -PT/OT eval   Generalized anxiety disorder: On scheduled Wellbutrin and Lexapro as an outpatient.  Additionally, she is on scheduled daily Ativan at home as well.    anemia -expected post op blood loss -Hgb seems to have stabilized -PO iron   Orthostatic hypotension -improved with IVF   Constipation -bowel regimen    Medical Consultants:   ortho   Discharge Exam:   Vitals:   12/01/21 2146 12/02/21 0745  BP: 112/64 130/82  Pulse: (!) 118 100  Resp: 18 18  Temp: 98.7 F (37.1 C) 98.4 F (36.9 C)  SpO2: 94% 96%   Vitals:   12/01/21 0741 12/01/21 1811 12/01/21 2146 12/02/21 0745  BP: 125/73 112/73 112/64 130/82  Pulse: (!) 109 (!) 109 (!) 118 100  Resp: 16 16 18 18   Temp: (!) 97.4 F (36.3 C) 98.8 F (37.1 C) 98.7 F (37.1 C) 98.4 F (36.9 C)  TempSrc: Oral  Oral   SpO2: 95% 97% 94% 96%  Weight:      Height:        General exam: Appears calm and  comfortable.     The results of significant diagnostics from this hospitalization (including imaging, microbiology, ancillary and laboratory) are listed below for reference.     Procedures and Diagnostic Studies:   DG Pelvis Portable  Result Date: 11/28/2021 CLINICAL DATA:  Postop total replacement of LEFT hip. EXAM: PORTABLE PELVIS 1-2 VIEWS COMPARISON:  Plain film of the LEFT femur dated 11/27/2021. FINDINGS: LEFT hip arthroplasty hardware is now in place. Hardware appears intact and appropriately positioned. Osseous alignment is anatomic. Expected postsurgical changes are seen within the overlying soft tissues. IMPRESSION: Status post LEFT hip arthroplasty. No evidence of surgical complicating feature. Electronically Signed   By: Franki Cabot M.D.   On: 11/28/2021 12:43   DG HIP UNILAT WITH PELVIS 1V LEFT  Result Date: 11/28/2021 CLINICAL DATA:  Left femoral neck fracture EXAM: DG HIP (WITH OR WITHOUT PELVIS) 1V*L* COMPARISON:  11/27/2021 FLUOROSCOPY TIME:  Radiation Exposure Index (as provided by the fluoroscopic device): 2.53 mGy If the device does not provide the exposure index: Fluoroscopy Time:  7.1 seconds Number of Acquired Images:  3 FINDINGS: Left hip prosthesis is now seen in satisfactory position. Calcified uterine fibroid is again noted. No other focal abnormality is seen. IMPRESSION: Status post left hip replacement Electronically Signed   By: Inez Catalina M.D.   On: 11/28/2021 11:53   DG C-Arm 1-60 Min-No Report  Result Date: 11/28/2021 Fluoroscopy was utilized by the requesting physician.  No radiographic interpretation.   DG Chest Port 1 View  Result Date: 11/27/2021 CLINICAL DATA:  fall hip fracture EXAM: PORTABLE CHEST 1 VIEW COMPARISON:  None Available. FINDINGS: The heart and mediastinal contours are within normal limits. No focal consolidation. No pulmonary edema. No pleural effusion. No pneumothorax. No acute osseous abnormality. IMPRESSION: No active disease.  Electronically Signed   By: Iven Finn M.D.   On: 11/27/2021 21:33   DG FEMUR MIN 2 VIEWS LEFT  Result Date: 11/27/2021 CLINICAL DATA:  [id number] Pain [id number] EXAM: LEFT FEMUR 2 VIEWS COMPARISON:  Ultrasound pelvis 08/22/2020 FINDINGS: Acute superiorly displaced left femoral neck fracture. Distally no acute displaced fracture of the femur. Partially visualized left knee grossly unremarkable. No left hip dislocation. Soft tissues are unremarkable. Coarsely calcified lesion overlying the pelvis is nonspecific but could consistent with a known degenerative uterine fibroid. IMPRESSION: Acute superiorly displaced left femoral neck fracture. Electronically Signed   By: Iven Finn M.D.   On: 11/27/2021 20:58     Labs:   Basic Metabolic Panel: Recent Labs  Lab 11/27/21 2117 11/28/21 0339 11/30/21 0349 12/02/21 0343  NA 138 138 138 140  K 4.2 4.6 4.2 4.0  CL 103 103 107 107  CO2 27 30 27 26   GLUCOSE 115* 139* 125* 106*  BUN 23 20 12 10   CREATININE 0.92 0.92 0.70 0.67  CALCIUM 9.2 9.0 8.2* 8.4*  MG  --  2.1  --   --    GFR Estimated Creatinine Clearance: 52.7 mL/min (by C-G formula based on  SCr of 0.67 mg/dL). Liver Function Tests: Recent Labs  Lab 11/28/21 0339  AST 18  ALT 23  ALKPHOS 64  BILITOT 0.7  PROT 5.8*  ALBUMIN 3.4*   No results for input(s): "LIPASE", "AMYLASE" in the last 168 hours. No results for input(s): "AMMONIA" in the last 168 hours. Coagulation profile Recent Labs  Lab 11/27/21 2117  INR 1.0    CBC: Recent Labs  Lab 11/27/21 2117 11/28/21 0339 11/29/21 0311 11/30/21 0349 12/01/21 0917 12/02/21 0343  WBC 11.1* 7.4 9.2 8.0 6.4 7.0  NEUTROABS 8.8* 5.7  --   --   --   --   HGB 13.2 11.9* 9.4* 8.2* 8.1* 7.8*  HCT 40.8 36.0 29.4* 24.5* 25.8* 23.7*  MCV 92.1 93.3 94.5 92.5 95.9 92.6  PLT 170 152 129* 105* 130* 154   Cardiac Enzymes: No results for input(s): "CKTOTAL", "CKMB", "CKMBINDEX", "TROPONINI" in the last 168 hours. BNP: Invalid  input(s): "POCBNP" CBG: No results for input(s): "GLUCAP" in the last 168 hours. D-Dimer No results for input(s): "DDIMER" in the last 72 hours. Hgb A1c No results for input(s): "HGBA1C" in the last 72 hours. Lipid Profile No results for input(s): "CHOL", "HDL", "LDLCALC", "TRIG", "CHOLHDL", "LDLDIRECT" in the last 72 hours. Thyroid function studies No results for input(s): "TSH", "T4TOTAL", "T3FREE", "THYROIDAB" in the last 72 hours.  Invalid input(s): "FREET3" Anemia work up No results for input(s): "VITAMINB12", "FOLATE", "FERRITIN", "TIBC", "IRON", "RETICCTPCT" in the last 72 hours. Microbiology Recent Results (from the past 240 hour(s))  Surgical pcr screen     Status: None   Collection Time: 11/28/21  8:32 AM   Specimen: Nasal Mucosa; Nasal Swab  Result Value Ref Range Status   MRSA, PCR NEGATIVE NEGATIVE Final   Staphylococcus aureus NEGATIVE NEGATIVE Final    Comment: (NOTE) The Xpert SA Assay (FDA approved for NASAL specimens in patients 83 years of age and older), is one component of a comprehensive surveillance program. It is not intended to diagnose infection nor to guide or monitor treatment. Performed at [facility], [place] [address], [place], [place] [address]      Discharge Instructions:   Discharge Instructions     Diet general   Complete by: As directed    Increase activity slowly   Complete by: As directed    No wound care   Complete by: As directed       Allergies as of 12/02/2021       Reactions   Doxycycline    Doxycycline Hyclate Hives   Other reaction(s): Other (See Comments) Unknown   Povidone Iodine Hives        Medication List     STOP taking these medications    aspirin EC 81 MG tablet Replaced by: aspirin 81 MG chewable tablet   celecoxib 200 MG capsule Commonly known as: CELEBREX   HYDROcodone-acetaminophen 5-325 MG tablet Commonly known as: NORCO/VICODIN   methylPREDNISolone 4 MG tablet Commonly known as:  Medrol   traMADol 50 MG tablet Commonly known as: ULTRAM       TAKE these medications    aspirin 81 MG chewable tablet Chew 1 tablet (81 mg total) by mouth 2 (two) times daily. Replaces: aspirin EC 81 MG tablet   Azelastine HCl 0.15 % Soln Place 1 spray into both nostrils daily as needed (for congestion).   buPROPion ER 100 MG 12 hr tablet Commonly known as: WELLBUTRIN SR Take 100 mg by mouth daily.   cetirizine 10 MG tablet Commonly  known as: ZYRTEC Take 10 mg by mouth daily as needed for allergies.   dicyclomine 20 MG tablet Commonly known as: BENTYL Take 20 mg by mouth 4 (four) times daily as needed for spasms.   escitalopram 10 MG tablet Commonly known as: LEXAPRO Take 10 mg by mouth daily.   ESTER C PO Take 1 tablet by mouth daily.   FIBER ADULT GUMMIES PO Take 1 tablet by mouth 3 (three) times daily.   Fish Oil 1200 MG Cpdr Take 1 tablet by mouth 2 (two) times daily.   Glucosamine Sulfate 1000 MG Caps Take 1 capsule by mouth daily.   iron polysaccharides 150 MG capsule Commonly known as: NIFEREX Take 1 capsule (150 mg total) by mouth daily.   LORazepam 1 MG tablet Commonly known as: ATIVAN Take 1.5 mg by mouth daily.   Magnesium 500 MG Caps Take 1 capsule by mouth 2 (two) times daily.   MULTI-VITAMINS PO Take 1 tablet by mouth daily.   multivitamin-lutein Caps capsule Take 2 capsules by mouth daily.   oxyCODONE 5 MG immediate release tablet Commonly known as: Oxy IR/ROXICODONE Take 1 tablet (5 mg total) by mouth every 6 (six) hours as needed for moderate pain (pain score 4-6).   pantoprazole 40 MG tablet Commonly known as: PROTONIX Take 40 mg by mouth every morning.   polyethylene glycol 17 g packet Commonly known as: MIRALAX / GLYCOLAX Take 17 g by mouth daily as needed for mild constipation.   traZODone 50 MG tablet Commonly known as: DESYREL Take 50 mg by mouth daily.   Vitamin D-3 125 MCG (5000 UT) Tabs Take 1 tablet by mouth  daily.               Durable Medical Equipment  (From admission, onward)           Start     Ordered   12/01/21 1613  For home use only DME Walker rolling  Once       Question Answer Comment  Walker: With [place] Wheels   Patient needs a walker to treat with the following condition Gait instability      12/01/21 1612   12/01/21 1612  For home use only DME Bedside commode  Once       Question:  Patient needs a bedside commode to treat with the following condition  Answer:  Gait instability   12/01/21 1612   11/28/21 1441  DME 3 n 1  Once        11/28/21 1440   11/28/21 1441  DME Walker rolling  Once       Question Answer Comment  Walker: With [place]   Patient needs a walker to treat with the following condition Status post total replacement of left hip      11/28/21 1440            Follow-up Information     Mcarthur Rossetti, MD. Schedule an appointment as soon as possible for a visit in 2 week(s).   Specialty: Orthopedic Surgery Contact information: [place] [address] [place], [place] Follow up.   Specialty: Home Health Services Why: Someone will call you to schedule first home visit. Contact information: [address] [place] [address] [phone]                  Time coordinating discharge: 45 min  Signed:  Janett Billow  U Penny Droz DO  [facility] 12/02/2021, 8:49 AM

## 2021-12-02 NOTE — TOC Transition Note (Signed)
Transition of Care (TOC) - CM/SW Discharge Note Marvetta Gibbons RN, BSN Transitions of Care Unit 4E- RN Case Manager See Treatment Team for direct phone # [phone] cross coverage  Patient Details  Name: Penny Henry MRN: [id number] Date of Birth: [birth year]  Transition of Care [facility]) CM/SW Contact:  Dawayne Patricia, RN Phone Number: 12/02/2021, 11:23 AM   Clinical Narrative:    Pt stable for transition home today, HH and DME has been arranged per previous CM note.  [place] liaison notified of discharge and will f/u for scheduling start of care for HHPT.   DME arranged and should have delivered to the room by [employer].   No further TOC needs noted.    Final next level of care: [place] Barriers to Discharge: Barriers Resolved   Patient Goals and CMS Choice Patient states their goals for this hospitalization and ongoing recovery are:: To return home CMS Medicare.gov Compare Post Acute Care list provided to:: Patient Choice offered to / list presented to : Patient  Discharge Placement                 Home w/ [facility]      Discharge Plan and Services   Discharge Planning Services: CM Consult Post Acute Care Choice: Home Health          DME Arranged: Bedside commode, Walker rolling DME Agency: [employer] Date DME Agency Contacted: 12/01/21 Time DME Agency Contacted: [phone] Representative spoke with at DME Agency: [place]: PT [place]: [place] Date [place]: 12/01/21 Time [place]: [place] Representative spoke with at [place]: [place] Determinants of Health ([place]) Interventions     Readmission Risk Interventions    12/02/2021   11:23 AM  Readmission Risk Prevention Plan  Post Dischage Appt Complete  Medication Screening Complete  Transportation Screening Complete

## 2021-12-04 ENCOUNTER — Encounter (HOSPITAL_COMMUNITY): Payer: Self-pay | Admitting: Orthopaedic Surgery

## 2021-12-09 ENCOUNTER — Telehealth: Payer: Self-pay

## 2021-12-09 NOTE — Telephone Encounter (Signed)
She doesn't have any special protocol with HHPT does she?

## 2021-12-16 ENCOUNTER — Ambulatory Visit (INDEPENDENT_AMBULATORY_CARE_PROVIDER_SITE_OTHER): Payer: Medicare PPO | Admitting: Physician Assistant

## 2021-12-16 ENCOUNTER — Encounter: Payer: Self-pay | Admitting: Physician Assistant

## 2021-12-16 DIAGNOSIS — M1712 Unilateral primary osteoarthritis, left knee: Secondary | ICD-10-CM | POA: Diagnosis not present

## 2021-12-16 DIAGNOSIS — Z96642 Presence of left artificial hip joint: Secondary | ICD-10-CM

## 2021-12-16 MED ORDER — LIDOCAINE HCL 1 % IJ SOLN
3.0000 mL | INTRAMUSCULAR | Status: AC | PRN
  Administered 2021-12-16: 3 mL

## 2021-12-16 MED ORDER — METHYLPREDNISOLONE ACETATE 40 MG/ML IJ SUSP
40.0000 mg | INTRAMUSCULAR | Status: AC | PRN
  Administered 2021-12-16: 40 mg via INTRA_ARTICULAR

## 2021-12-16 NOTE — Progress Notes (Signed)
HPI: [name] returns today status post left total hip arthroplasty 11-23.  Patient is overall doing well.  She is having more pain in her left knee and wants this drained.  She has known arthritis in the knee.  She unfortunately had to undergo a left total hip arthroplasty secondary to left hip fracture.  She is on aspirin for DVT prophylaxis.  She was on prior to surgery.  Reports that her pain is moderate.  She is taking Tylenol mostly during the day and oxycodone at night.  Review of systems: See HPI otherwise negative  Physical exam: General well-developed well-nourished female no acute distress.  Left lower extremity surgical incisions healing well.  Left calf supple nontender.  Left knee slight edema and ecchymosis.  Plus minus effusion.  No abnormal warmth erythema.  Dorsiflexion plantarflexion left ankle intact.  Impression: Status post left total hip arthroplasty secondary to hip fracture Left knee osteoarthritis  Plan: Staples removed Steri-Strips applied.  She will work on scar tissue mobilization.  She is weightbearing as tolerated.  She will progress to a cane as tolerated.  Per her request left knee aspiration attempt was performed.  Cortisone injection was given patient tolerated well.  She will go back to her regular 81 mg aspirin that she was on prior to surgery.  Follow-up with [place] in 4 weeks sooner if there is any questions concerns.  Procedure Note  Patient: Penny Henry             Date of Birth: 1940-08-28           MRN: [id number]             Visit Date: 12/16/2021  Procedures: Visit Diagnoses:  1. Status post total replacement of left hip   2. Primary osteoarthritis of left knee     Large Joint Inj: L knee on 12/16/2021 11:25 AM Indications: pain Details: 22 G 1.5 in needle, anterolateral approach  Arthrogram: No  Medications: 3 mL lidocaine 1 %; 40 mg methylPREDNISolone acetate 40 MG/ML Outcome: tolerated well, no immediate complications Procedure,  treatment alternatives, risks and benefits explained, specific risks discussed. Consent was given by the patient. Immediately prior to procedure a time out was called to verify the correct patient, procedure, equipment, support staff and site/side marked as required. Patient was prepped and draped in the usual sterile fashion.

## 2021-12-25 DIAGNOSIS — H353221 Exudative age-related macular degeneration, left eye, with active choroidal neovascularization: Secondary | ICD-10-CM | POA: Diagnosis not present

## 2022-01-13 ENCOUNTER — Encounter: Payer: Self-pay | Admitting: Physician Assistant

## 2022-01-13 ENCOUNTER — Ambulatory Visit (INDEPENDENT_AMBULATORY_CARE_PROVIDER_SITE_OTHER): Payer: Medicare PPO | Admitting: Physician Assistant

## 2022-01-13 DIAGNOSIS — Z79891 Long term (current) use of opiate analgesic: Secondary | ICD-10-CM | POA: Diagnosis not present

## 2022-01-13 DIAGNOSIS — Z96642 Presence of left artificial hip joint: Secondary | ICD-10-CM

## 2022-01-13 DIAGNOSIS — G894 Chronic pain syndrome: Secondary | ICD-10-CM | POA: Diagnosis not present

## 2022-01-13 DIAGNOSIS — M5416 Radiculopathy, lumbar region: Secondary | ICD-10-CM | POA: Diagnosis not present

## 2022-01-13 DIAGNOSIS — Z79899 Other long term (current) drug therapy: Secondary | ICD-10-CM | POA: Diagnosis not present

## 2022-01-13 NOTE — Progress Notes (Signed)
HPI: Penny Henry comes in today for follow-up left total hip arthroplasty.  This was performed secondary to hip fracture.  Dates she is overall doing well.  She has no pain whatsoever in the left hip.  States that she has had no fevers or chills.  She has no concerns in regards to the hip.  Physical exam: Left hip good range of motion she is able to cross her legs.  Dorsiflexion plantarflexion left ankle intact.  Ambulates without assistive device.  Surgical incisions healing well no signs of action or wound dehiscence.  Impression: Status post left total hip arthroplasty 11-23  Plan: She will continue scar tissue mobilization.  Follow-up with [place] in 4 and half months at that time we will obtain an AP pelvis and lateral view of her left hip.  She will follow-up sooner if there is any questions concerns.  Questions were encouraged and answered

## 2022-01-14 DIAGNOSIS — Z96642 Presence of left artificial hip joint: Secondary | ICD-10-CM | POA: Diagnosis not present

## 2022-01-14 DIAGNOSIS — Z23 Encounter for immunization: Secondary | ICD-10-CM | POA: Diagnosis not present

## 2022-01-14 DIAGNOSIS — D62 Acute posthemorrhagic anemia: Secondary | ICD-10-CM | POA: Diagnosis not present

## 2022-01-14 DIAGNOSIS — Z6822 Body mass index (BMI) 22.0-22.9, adult: Secondary | ICD-10-CM | POA: Diagnosis not present

## 2022-01-14 DIAGNOSIS — F325 Major depressive disorder, single episode, in full remission: Secondary | ICD-10-CM | POA: Diagnosis not present

## 2022-01-17 ENCOUNTER — Other Ambulatory Visit: Payer: Medicare PPO

## 2022-01-29 DIAGNOSIS — H353221 Exudative age-related macular degeneration, left eye, with active choroidal neovascularization: Secondary | ICD-10-CM | POA: Diagnosis not present

## 2022-02-11 IMAGING — MG MM DIGITAL DIAGNOSTIC UNILAT*R* W/ TOMO W/ CAD
6 series · 6 of 18 positions shown · non-contrast
Comparison: Previous exam(s).

CLINICAL DATA: 79-year-old female for further evaluation of
possible RIGHT breast mass on screening mammogram.

EXAM:
DIGITAL DIAGNOSTIC UNILATERAL RIGHT MAMMOGRAM WITH TOMOSYNTHESIS AND
CAD
TECHNIQUE: Right digital diagnostic mammography and breast tomosynthesis was
performed. The images were evaluated with computer-aided detection.

[R CC synth-2D (1 of 2)]
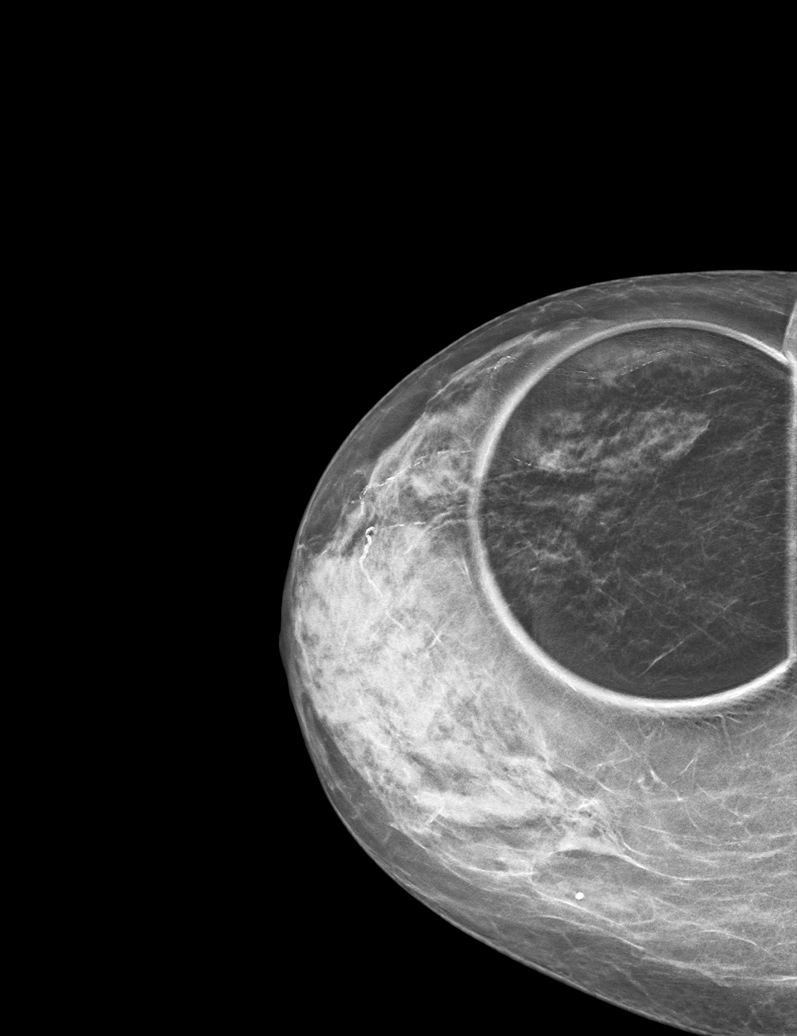

[R CC synth-2D (2 of 2)]
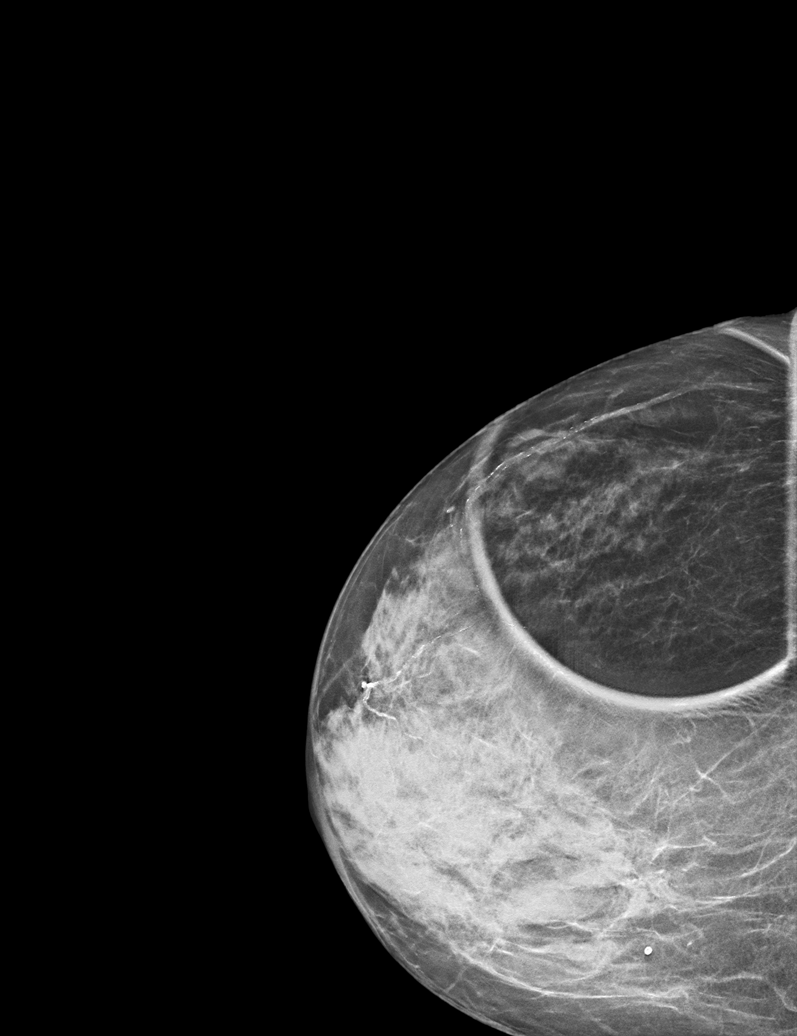

[R MLO synth-2D]
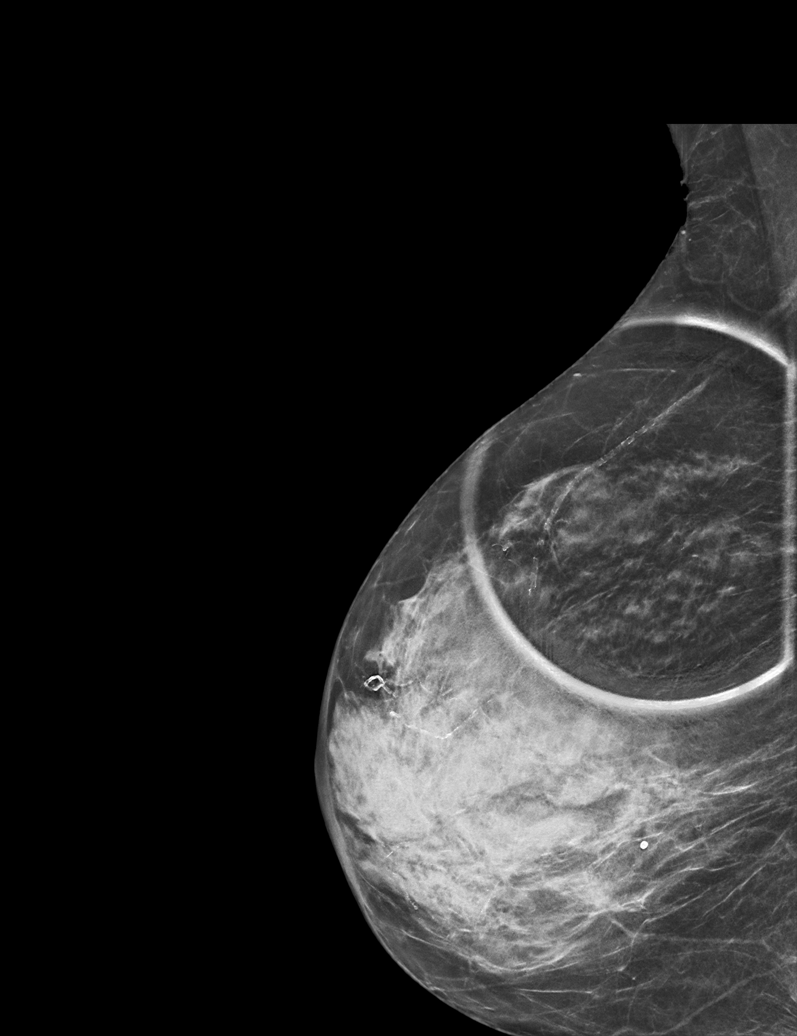

[R CC tomo (1 of 2) · tomo slice 22/43.0]
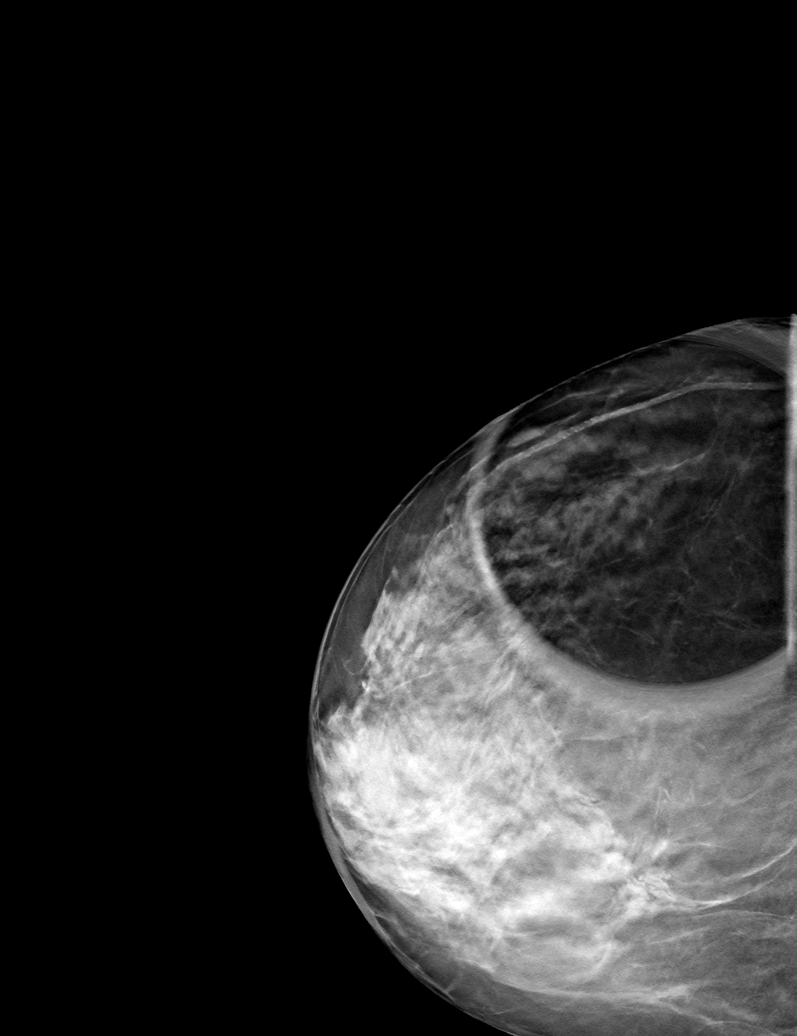

[R CC tomo (2 of 2) · tomo slice 22/43.0]
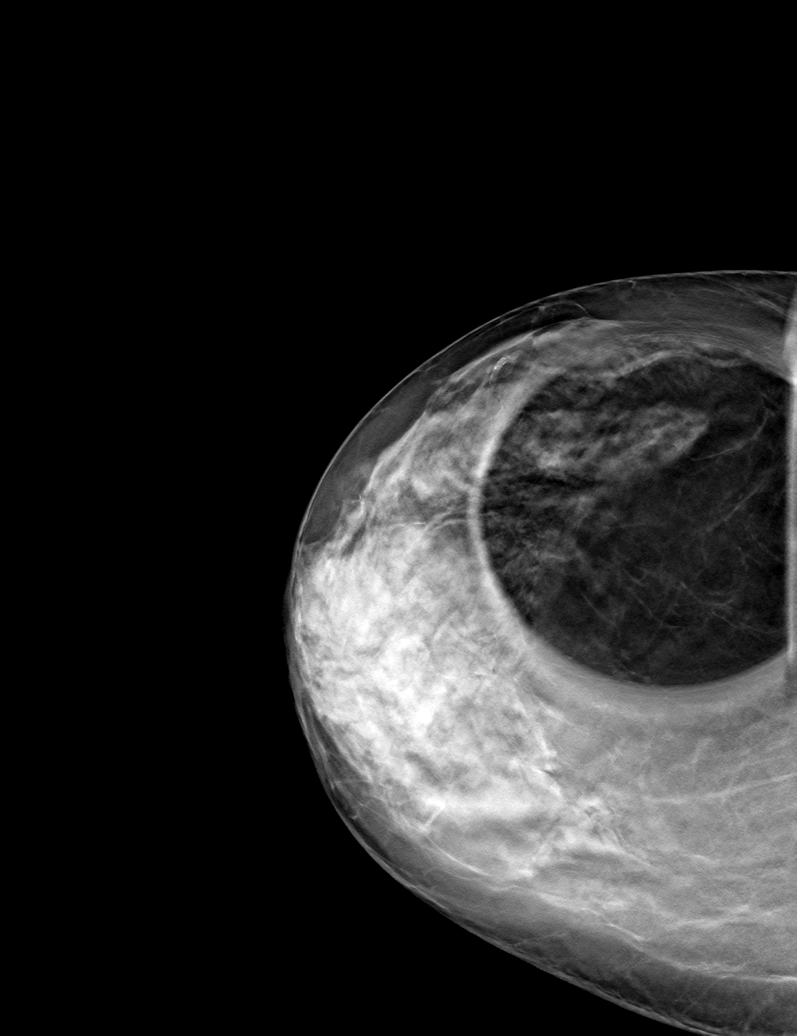

[R MLO tomo · tomo slice 25/50.0]
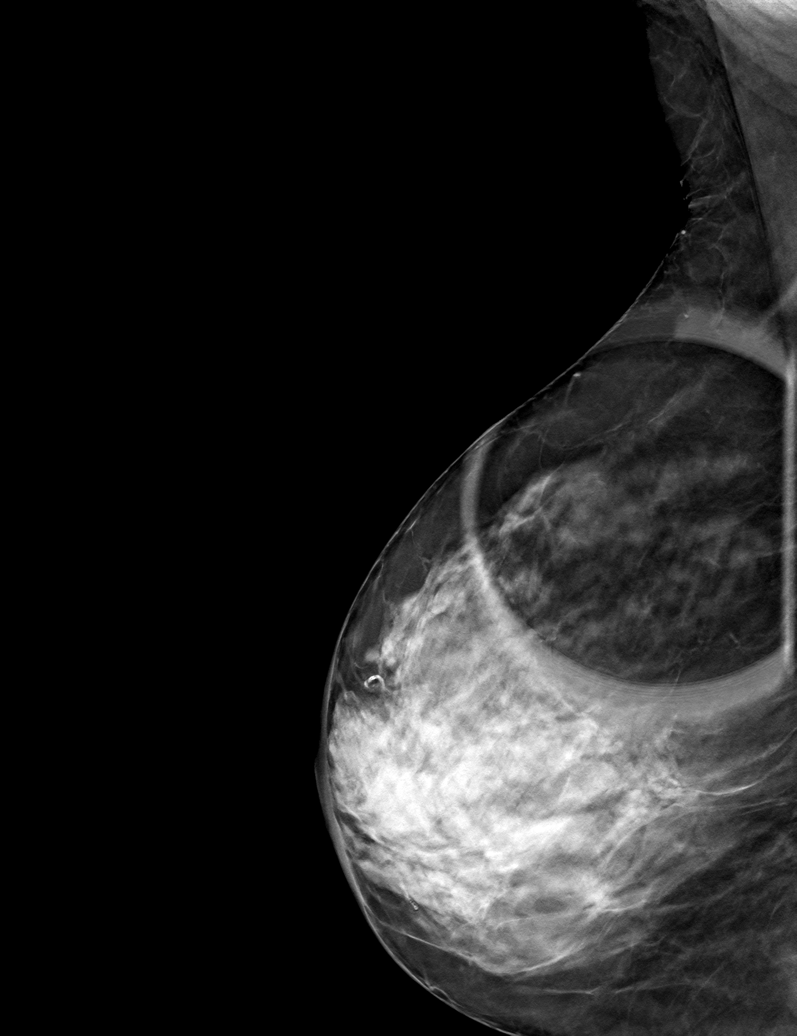

[6 of 18 positions shown; findings below may reference images not displayed]

ACR Breast Density Category c: The breast tissue is heterogeneously
dense, which may obscure small masses.
FINDINGS: 2D/3D spot compression views of the RIGHT breast demonstrate
dispersal of the screening study abnormality. No persistent
abnormality at the site of the screening study finding is
identified.
IMPRESSION: No persistent abnormality at the site of the screening study
finding.

RECOMMENDATION:
Bilateral screening mammogram in 1 year.

I have discussed the findings and recommendations with the patient.
If applicable, a reminder letter will be sent to the patient
regarding the next appointment.

BI-RADS CATEGORY  1: Negative.

## 2022-02-25 ENCOUNTER — Other Ambulatory Visit: Payer: Self-pay | Admitting: Orthopaedic Surgery

## 2022-03-12 DIAGNOSIS — H353221 Exudative age-related macular degeneration, left eye, with active choroidal neovascularization: Secondary | ICD-10-CM | POA: Diagnosis not present

## 2022-03-31 ENCOUNTER — Telehealth: Payer: Self-pay | Admitting: Orthopaedic Surgery

## 2022-03-31 NOTE — Telephone Encounter (Signed)
Patient called. Says she is returning a call to someone.

## 2022-03-31 NOTE — Telephone Encounter (Signed)
Tried calling patient back.  Just need clarification on which knee for gel injection.

## 2022-03-31 NOTE — Telephone Encounter (Signed)
VOB submitted for Monovisc, left knee

## 2022-03-31 NOTE — Telephone Encounter (Signed)
Patient states she want a gel injection, please advise

## 2022-04-03 ENCOUNTER — Encounter: Payer: Self-pay | Admitting: Radiology

## 2022-04-08 ENCOUNTER — Telehealth: Payer: Self-pay | Admitting: Orthopaedic Surgery

## 2022-04-08 ENCOUNTER — Telehealth: Payer: Self-pay

## 2022-04-08 NOTE — Telephone Encounter (Signed)
Please get auth for repeat left knee gel inj-[facility] pt

## 2022-04-08 NOTE — Telephone Encounter (Signed)
Pt called said it's her left knee. Pt states she has a couple questions for April J. Please call pt at [phone].

## 2022-04-08 NOTE — Telephone Encounter (Signed)
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

## 2022-04-08 NOTE — Telephone Encounter (Signed)
I called and talked to the pt. I advised this is no longer needed since she is 3 months out. She stated understanding. She also asked if we can get authorization for another gel injection in her left knee. I advised I will send a request and she will be contacted soon to set this up.

## 2022-04-09 ENCOUNTER — Other Ambulatory Visit: Payer: Self-pay

## 2022-04-09 DIAGNOSIS — M1712 Unilateral primary osteoarthritis, left knee: Secondary | ICD-10-CM

## 2022-04-09 NOTE — Telephone Encounter (Signed)
Talked with patient concerning gel injection and appointment has been scheduled.

## 2022-04-09 NOTE — Telephone Encounter (Signed)
Previously submitted  

## 2022-04-10 ENCOUNTER — Telehealth: Payer: Self-pay

## 2022-04-10 NOTE — Telephone Encounter (Signed)
Faxed prophylaxis forms (both the form from Dr. Kirstie Mirza office [dentist], and Dr. Trevor Mace standard form)  back to [phone]. She has an appt there on Monday for a dental procedure.

## 2022-04-14 DIAGNOSIS — M5416 Radiculopathy, lumbar region: Secondary | ICD-10-CM | POA: Diagnosis not present

## 2022-04-21 DIAGNOSIS — H353221 Exudative age-related macular degeneration, left eye, with active choroidal neovascularization: Secondary | ICD-10-CM | POA: Diagnosis not present

## 2022-04-28 ENCOUNTER — Encounter: Payer: Self-pay | Admitting: Orthopaedic Surgery

## 2022-04-28 ENCOUNTER — Ambulatory Visit: Payer: Medicare PPO | Admitting: Orthopaedic Surgery

## 2022-04-28 DIAGNOSIS — M1712 Unilateral primary osteoarthritis, left knee: Secondary | ICD-10-CM | POA: Diagnosis not present

## 2022-04-28 MED ORDER — LIDOCAINE HCL 1 % IJ SOLN
3.0000 mL | INTRAMUSCULAR | Status: AC | PRN
  Administered 2022-04-28: 3 mL

## 2022-04-28 MED ORDER — HYALURONAN 88 MG/4ML IX SOSY
88.0000 mg | PREFILLED_SYRINGE | INTRA_ARTICULAR | Status: AC | PRN
  Administered 2022-04-28: 88 mg via INTRA_ARTICULAR

## 2022-04-28 NOTE — Progress Notes (Signed)
   Procedure Note  Patient: Penny Henry             Date of Birth: Mar 27, 1940           MRN: [id number]             Visit Date: 04/28/2022  Procedures: Visit Diagnoses:  1. Unilateral primary osteoarthritis, left knee     Large Joint Inj: L knee on 04/28/2022 10:07 AM Indications: diagnostic evaluation and pain Details: 22 G 1.5 in needle, superolateral approach  Arthrogram: No  Medications: 3 mL lidocaine 1 %; 88 mg Hyaluronan 88 MG/4ML Outcome: tolerated well, no immediate complications Procedure, treatment alternatives, risks and benefits explained, specific risks discussed. Consent was given by the patient. Immediately prior to procedure a time out was called to verify the correct patient, procedure, equipment, support staff and site/side marked as required. Patient was prepped and draped in the usual sterile fashion.    The patient comes in today for scheduled hyaluronic acid injection in her left knee with Monovisc to treat the pain from osteoarthritis.  She is 82 years old and very active.  We actually replaced her left hip in November of last year.  Her knee just has a mild effusion today.  She does wear compressive sleeve.  I did place Monovisc in her left knee without difficulty.  She tolerated well.  The next time we need to see her is about 3 months from now.  Will have a standing low AP pelvis at that visit.  If conservative treatment fails for her left knee the next recommendation will be a knee replacement.  We can also place a steroid injection in her left knee in 3 months if she would like to have that done.   Lot number: [id number] Expiration date 11/18/2024

## 2022-05-15 ENCOUNTER — Ambulatory Visit: Payer: Medicare PPO | Admitting: Orthopaedic Surgery

## 2022-05-26 DIAGNOSIS — H353221 Exudative age-related macular degeneration, left eye, with active choroidal neovascularization: Secondary | ICD-10-CM | POA: Diagnosis not present

## 2022-06-24 DIAGNOSIS — H353221 Exudative age-related macular degeneration, left eye, with active choroidal neovascularization: Secondary | ICD-10-CM | POA: Diagnosis not present

## 2022-07-01 DIAGNOSIS — Z79899 Other long term (current) drug therapy: Secondary | ICD-10-CM | POA: Diagnosis not present

## 2022-07-01 DIAGNOSIS — Z0000 Encounter for general adult medical examination without abnormal findings: Secondary | ICD-10-CM | POA: Diagnosis not present

## 2022-07-01 DIAGNOSIS — R7302 Impaired glucose tolerance (oral): Secondary | ICD-10-CM | POA: Diagnosis not present

## 2022-07-01 DIAGNOSIS — F325 Major depressive disorder, single episode, in full remission: Secondary | ICD-10-CM | POA: Diagnosis not present

## 2022-07-01 DIAGNOSIS — R200 Anesthesia of skin: Secondary | ICD-10-CM | POA: Diagnosis not present

## 2022-07-01 DIAGNOSIS — Z6822 Body mass index (BMI) 22.0-22.9, adult: Secondary | ICD-10-CM | POA: Diagnosis not present

## 2022-07-01 DIAGNOSIS — R202 Paresthesia of skin: Secondary | ICD-10-CM | POA: Diagnosis not present

## 2022-07-01 DIAGNOSIS — R32 Unspecified urinary incontinence: Secondary | ICD-10-CM | POA: Diagnosis not present

## 2022-07-01 DIAGNOSIS — J309 Allergic rhinitis, unspecified: Secondary | ICD-10-CM | POA: Diagnosis not present

## 2022-07-15 DIAGNOSIS — Z79891 Long term (current) use of opiate analgesic: Secondary | ICD-10-CM | POA: Diagnosis not present

## 2022-07-15 DIAGNOSIS — G894 Chronic pain syndrome: Secondary | ICD-10-CM | POA: Diagnosis not present

## 2022-07-15 DIAGNOSIS — M5416 Radiculopathy, lumbar region: Secondary | ICD-10-CM | POA: Diagnosis not present

## 2022-07-15 DIAGNOSIS — Z79899 Other long term (current) drug therapy: Secondary | ICD-10-CM | POA: Diagnosis not present

## 2022-07-23 DIAGNOSIS — H353221 Exudative age-related macular degeneration, left eye, with active choroidal neovascularization: Secondary | ICD-10-CM | POA: Diagnosis not present

## 2022-08-20 ENCOUNTER — Ambulatory Visit: Payer: Medicare PPO | Admitting: Orthopaedic Surgery

## 2022-08-20 ENCOUNTER — Encounter: Payer: Self-pay | Admitting: Orthopaedic Surgery

## 2022-08-20 ENCOUNTER — Other Ambulatory Visit (INDEPENDENT_AMBULATORY_CARE_PROVIDER_SITE_OTHER): Payer: Medicare PPO

## 2022-08-20 DIAGNOSIS — Z96642 Presence of left artificial hip joint: Secondary | ICD-10-CM

## 2022-08-20 NOTE — Progress Notes (Signed)
Office Visit Note   Patient: Penny Henry           Date of Birth: 1940-04-30           MRN: [id number] Visit Date: 08/20/2022              Requested by: Penny Malta, MD [address] [place],  [place] [address] PCP: Penny Malta, MD   Assessment & Plan: Visit Diagnoses:  1. History of left hip replacement     Plan:  Will have her follow-up as needed point.  She understands that she can have cortisone injections in the left knee a months.  Questions were encouraged and answered by Penny Henry myself.  In regards to the trigger finger she will follow-up if she has any continued triggering or pain. Patient also ask about her feet and the fact that they are cold in the morning and change colors turning blue.  She did have palpable pulses in both feet.  Differential would be Raynaud's.  Suggest that she wear socks to bed as she feels her feet are cold just in the morning but to avoid wearing socks on bare floors advised her she at least slippers around the house.   Follow-Up Instructions: Return if symptoms worsen or fail to improve.   Orders:  Orders Placed This Encounter  Procedures   XR Pelvis 1-2 Views   No orders of the defined types were placed in this encounter.     Procedures: No procedures performed   Clinical Data: No additional findings.   Subjective: Chief Complaint  Patient presents with   Left Knee - Follow-up    HPI Penny Henry returns today today wanting to discuss her left knee.  She has known arthritis of the left knee.  Underwent gel injection on 04/28/2022 which gave her no relief.  She is also status post left total hip arthroplasty 11/28/2021 secondary to hip fracture.  She is having no problems with her hip whatsoever.  She notes that her left knee pain does not bother daily but at times she can have 6 out of 10 pain.  Whenever she has these episodes of severe pain in the knee she takes Tylenol and tramadol.  She has had no new injury to  the knee Also asking about a right finger that is triggering.  She states that her index finger gets caught she has to bring it out to full extension no known injury.  She is nondiabetic.  Denies any fevers chills Review of Systems See HPI otherwise negative  Objective: Vital Signs: There were no vitals taken for this visit.  Physical Exam General: Well-developed well-nourished female no acute distress mood and affect Ortho Exam Left hip excellent range of motion without Left knee valgus deformity.  No abnormal warmth. Right hand: No rashes skin lesions ulcerations.  Active triggering of the right index finger.  Palpable A1 pulley.  Specialty Comments:  No specialty comments available.  Imaging: No results found.   PMFS History: Patient Active Problem List   Diagnosis Date Noted   Fall at home, initial encounter 11/28/2021   Leukocytosis 11/28/2021   GAD (generalized anxiety disorder) 11/28/2021   Closed displaced fracture of neck of left femur (HCC) 11/27/2021   Nontraumatic complete tear of left rotator cuff 12/08/2019   Chronic left shoulder pain 12/08/2019   Chronic pain of left knee 10/14/2017   Unilateral primary osteoarthritis, left knee 09/16/2017   Stasis dermatitis 09/16/2017   Allergic rhinitis 09/08/2017  PVC (premature ventricular contraction) 08/03/2014   SVT (supraventricular tachycardia) 08/03/2014   Past Medical History:  Diagnosis Date   Acid reflux disease    Anxiety    Borderline hypercholesterolemia    Insomnia    Irritable bowel syndrome     Family History  Problem Relation Age of Onset   Arthritis Mother    Arthritis Father     Past Surgical History:  Procedure Laterality Date   KNEE SURGERY     TOTAL HIP ARTHROPLASTY Left 11/28/2021   Procedure: TOTAL HIP ARTHROPLASTY ANTERIOR APPROACH;  Surgeon: Kathryne Hitch, MD;  Location: [facility] OR;  Service: Orthopedics;  Laterality: Left;   Social History   Occupational History   Not on  file  Tobacco Use   Smoking status: Never   Smokeless tobacco: Never  Vaping Use   Vaping status: Never Used  Substance and Sexual Activity   Alcohol use: Not Currently   Drug use: Not Currently   Sexual activity: Not on file

## 2022-09-10 DIAGNOSIS — H353221 Exudative age-related macular degeneration, left eye, with active choroidal neovascularization: Secondary | ICD-10-CM | POA: Diagnosis not present

## 2022-09-30 ENCOUNTER — Other Ambulatory Visit: Payer: Self-pay | Admitting: Family Medicine

## 2022-09-30 DIAGNOSIS — Z1231 Encounter for screening mammogram for malignant neoplasm of breast: Secondary | ICD-10-CM

## 2022-10-01 DIAGNOSIS — F325 Major depressive disorder, single episode, in full remission: Secondary | ICD-10-CM | POA: Diagnosis not present

## 2022-10-09 DIAGNOSIS — M1712 Unilateral primary osteoarthritis, left knee: Secondary | ICD-10-CM | POA: Diagnosis not present

## 2022-10-09 DIAGNOSIS — Z6822 Body mass index (BMI) 22.0-22.9, adult: Secondary | ICD-10-CM | POA: Diagnosis not present

## 2022-10-09 DIAGNOSIS — M25462 Effusion, left knee: Secondary | ICD-10-CM | POA: Diagnosis not present

## 2022-10-09 DIAGNOSIS — M25562 Pain in left knee: Secondary | ICD-10-CM | POA: Diagnosis not present

## 2022-10-15 DIAGNOSIS — M545 Low back pain, unspecified: Secondary | ICD-10-CM | POA: Diagnosis not present

## 2022-10-15 DIAGNOSIS — M5416 Radiculopathy, lumbar region: Secondary | ICD-10-CM | POA: Diagnosis not present

## 2022-10-29 DIAGNOSIS — H353221 Exudative age-related macular degeneration, left eye, with active choroidal neovascularization: Secondary | ICD-10-CM | POA: Diagnosis not present

## 2022-11-12 DIAGNOSIS — M85839 Other specified disorders of bone density and structure, unspecified forearm: Secondary | ICD-10-CM | POA: Diagnosis not present

## 2022-11-12 DIAGNOSIS — F325 Major depressive disorder, single episode, in full remission: Secondary | ICD-10-CM | POA: Diagnosis not present

## 2022-11-12 DIAGNOSIS — Z6823 Body mass index (BMI) 23.0-23.9, adult: Secondary | ICD-10-CM | POA: Diagnosis not present

## 2022-11-12 DIAGNOSIS — Z96642 Presence of left artificial hip joint: Secondary | ICD-10-CM | POA: Diagnosis not present

## 2022-11-20 ENCOUNTER — Ambulatory Visit
Admission: RE | Admit: 2022-11-20 | Discharge: 2022-11-20 | Disposition: A | Discharge status: Home / Self Care | Payer: Medicare PPO | Source: Ambulatory Visit | Attending: Family Medicine | Admitting: Family Medicine

## 2022-11-20 DIAGNOSIS — Z1231 Encounter for screening mammogram for malignant neoplasm of breast: Secondary | ICD-10-CM | POA: Diagnosis not present

## 2022-11-20 DIAGNOSIS — H21231 Degeneration of iris (pigmentary), right eye: Secondary | ICD-10-CM | POA: Diagnosis not present

## 2022-11-20 DIAGNOSIS — Z961 Presence of intraocular lens: Secondary | ICD-10-CM | POA: Diagnosis not present

## 2022-12-29 DIAGNOSIS — H353221 Exudative age-related macular degeneration, left eye, with active choroidal neovascularization: Secondary | ICD-10-CM | POA: Diagnosis not present

## 2023-01-26 ENCOUNTER — Telehealth: Payer: Self-pay | Admitting: Orthopaedic Surgery

## 2023-01-26 NOTE — Telephone Encounter (Signed)
Patient called. She would like gel injection in her L knee.

## 2023-02-09 NOTE — Telephone Encounter (Signed)
 VOB submitted for Monovisc, left knee

## 2023-02-19 ENCOUNTER — Ambulatory Visit: Payer: Medicare PPO | Admitting: Physician Assistant

## 2023-02-19 ENCOUNTER — Other Ambulatory Visit: Payer: Self-pay

## 2023-02-19 DIAGNOSIS — Z79891 Long term (current) use of opiate analgesic: Secondary | ICD-10-CM | POA: Diagnosis not present

## 2023-02-19 DIAGNOSIS — Z79899 Other long term (current) drug therapy: Secondary | ICD-10-CM | POA: Diagnosis not present

## 2023-02-19 DIAGNOSIS — G894 Chronic pain syndrome: Secondary | ICD-10-CM | POA: Diagnosis not present

## 2023-02-19 DIAGNOSIS — M1712 Unilateral primary osteoarthritis, left knee: Secondary | ICD-10-CM

## 2023-02-19 DIAGNOSIS — M5416 Radiculopathy, lumbar region: Secondary | ICD-10-CM | POA: Diagnosis not present

## 2023-02-19 DIAGNOSIS — M545 Low back pain, unspecified: Secondary | ICD-10-CM | POA: Diagnosis not present

## 2023-02-19 DIAGNOSIS — Z6822 Body mass index (BMI) 22.0-22.9, adult: Secondary | ICD-10-CM | POA: Diagnosis not present

## 2023-02-19 MED ORDER — LIDOCAINE HCL 1 % IJ SOLN
3.0000 mL | INTRAMUSCULAR | Status: AC | PRN
  Administered 2023-02-19: 3 mL

## 2023-02-19 MED ORDER — HYALURONAN 88 MG/4ML IX SOSY
88.0000 mg | PREFILLED_SYRINGE | INTRA_ARTICULAR | Status: AC | PRN
  Administered 2023-02-19: 88 mg via INTRA_ARTICULAR

## 2023-02-19 NOTE — Progress Notes (Signed)
   Procedure Note  Patient: Penny Henry             Date of Birth: 1940/07/11           MRN: [id number]             Visit Date: 02/19/2023  HPI: Mrs. Cira Servant returns today for scheduled left knee Monovisc injection.  She does report that 3 months ago she had an aspiration and injection of cortisone and that her left knee at her primary care office and this was helpful.  She is taking tramadol and Tylenol for her knee pain and this helps some.  She has known osteoarthritis of the left knee.  She has no scheduled surgery for the left knee in the next 6 months.  She has had no new injury to the knee.  Physical exam: Left knee good range of motion.  No abnormal warmth erythema.  No effusion.  Procedures: Visit Diagnoses:  1. Unilateral primary osteoarthritis, left knee     Large Joint Inj: L knee on 02/19/2023 4:08 PM Indications: pain Details: 22 G 1.5 in needle, superolateral approach  Arthrogram: No  Medications: 88 mg Hyaluronan 88 MG/4ML; 3 mL lidocaine 1 % Outcome: tolerated well, no immediate complications Procedure, treatment alternatives, risks and benefits explained, specific risks discussed. Consent was given by the patient. Immediately prior to procedure a time out was called to verify the correct patient, procedure, equipment, support staff and site/side marked as required. Patient was prepped and draped in the usual sterile fashion.    Plan: She knows to wait at least 6 months between viscosupplementation injections.  She will follow-up with [place] as needed.  Ace bandage was applied to the leg today she will remove this before retiring to bed this evening.

## 2023-02-24 DIAGNOSIS — H353221 Exudative age-related macular degeneration, left eye, with active choroidal neovascularization: Secondary | ICD-10-CM | POA: Diagnosis not present

## 2023-04-20 DIAGNOSIS — H353221 Exudative age-related macular degeneration, left eye, with active choroidal neovascularization: Secondary | ICD-10-CM | POA: Diagnosis not present

## 2023-04-22 DIAGNOSIS — M1712 Unilateral primary osteoarthritis, left knee: Secondary | ICD-10-CM | POA: Diagnosis not present

## 2023-05-18 DIAGNOSIS — M5416 Radiculopathy, lumbar region: Secondary | ICD-10-CM | POA: Diagnosis not present

## 2023-05-18 DIAGNOSIS — M545 Low back pain, unspecified: Secondary | ICD-10-CM | POA: Diagnosis not present

## 2023-06-15 DIAGNOSIS — H353221 Exudative age-related macular degeneration, left eye, with active choroidal neovascularization: Secondary | ICD-10-CM | POA: Diagnosis not present

## 2023-06-15 DIAGNOSIS — H35721 Serous detachment of retinal pigment epithelium, right eye: Secondary | ICD-10-CM | POA: Diagnosis not present

## 2023-06-15 DIAGNOSIS — H35371 Puckering of macula, right eye: Secondary | ICD-10-CM | POA: Diagnosis not present

## 2023-06-15 DIAGNOSIS — H35363 Drusen (degenerative) of macula, bilateral: Secondary | ICD-10-CM | POA: Diagnosis not present

## 2023-07-10 DIAGNOSIS — M1712 Unilateral primary osteoarthritis, left knee: Secondary | ICD-10-CM | POA: Diagnosis not present

## 2023-07-20 DIAGNOSIS — H353221 Exudative age-related macular degeneration, left eye, with active choroidal neovascularization: Secondary | ICD-10-CM | POA: Diagnosis not present

## 2023-07-20 DIAGNOSIS — H35363 Drusen (degenerative) of macula, bilateral: Secondary | ICD-10-CM | POA: Diagnosis not present

## 2023-07-20 DIAGNOSIS — H353111 Nonexudative age-related macular degeneration, right eye, early dry stage: Secondary | ICD-10-CM | POA: Diagnosis not present

## 2023-07-20 DIAGNOSIS — H35371 Puckering of macula, right eye: Secondary | ICD-10-CM | POA: Diagnosis not present

## 2023-08-10 DIAGNOSIS — H353221 Exudative age-related macular degeneration, left eye, with active choroidal neovascularization: Secondary | ICD-10-CM | POA: Diagnosis not present

## 2023-08-13 ENCOUNTER — Telehealth: Payer: Self-pay

## 2023-08-13 ENCOUNTER — Ambulatory Visit: Admitting: Orthopaedic Surgery

## 2023-08-13 DIAGNOSIS — M1712 Unilateral primary osteoarthritis, left knee: Secondary | ICD-10-CM

## 2023-08-13 DIAGNOSIS — G8929 Other chronic pain: Secondary | ICD-10-CM

## 2023-08-13 DIAGNOSIS — M25562 Pain in left knee: Secondary | ICD-10-CM

## 2023-08-13 NOTE — Telephone Encounter (Signed)
 mailed

## 2023-08-13 NOTE — Progress Notes (Signed)
 The patient is an 83 year old female well-known to us .  We have seen her for many years now as it relates to severe arthritis of her left knee.  She is at the point where she has tried and failed all forms of conservative treatment.  She has had multiple steroid injections in that knee over the years and multiple hyaluronic acid injections.  She has had aspirations of large joint effusions as well.  She has valgus malalignment of that knee.  At this point her left knee pain is daily and is 10 out of 10.  It is detrimentally affecting her mobility, quality of life and her actives of daily living to the point she does wish to proceed with knee replacement.  She is not on blood thinning medications either.  She has had no acute change in her medical status.  I was able to review all of her medications and past medical history within epic.  On exam her left knee has an effusion today.  There is valgus malalignment of that knee but good range of motion but pain in all 3 compartments.  Past x-rays of the left knee show valgus malalignment with bone-on-bone wear in all 3 compartments and osteophytes in all 3 compartments.  We had a long and thorough discussion about knee replacement surgery.  I discussed the risks and benefits of the surgery and what to expect from an intraoperative and postoperative standpoint.  I showed her knee replacement model and talked about the surgery in great detail.  All questions and concerns were answered and addressed.  Will work on getting her on the schedule for a left total knee replacement.

## 2023-08-13 NOTE — Telephone Encounter (Signed)
 Patient is wanting a handicap placard filled out for the [facility] and would like for it to be mailed to the address we have on file.

## 2023-08-27 DIAGNOSIS — I872 Venous insufficiency (chronic) (peripheral): Secondary | ICD-10-CM | POA: Diagnosis not present

## 2023-08-27 DIAGNOSIS — R197 Diarrhea, unspecified: Secondary | ICD-10-CM | POA: Diagnosis not present

## 2023-08-27 DIAGNOSIS — Z6821 Body mass index (BMI) 21.0-21.9, adult: Secondary | ICD-10-CM | POA: Diagnosis not present

## 2023-08-27 DIAGNOSIS — Z131 Encounter for screening for diabetes mellitus: Secondary | ICD-10-CM | POA: Diagnosis not present

## 2023-08-27 DIAGNOSIS — Z0000 Encounter for general adult medical examination without abnormal findings: Secondary | ICD-10-CM | POA: Diagnosis not present

## 2023-08-27 DIAGNOSIS — K219 Gastro-esophageal reflux disease without esophagitis: Secondary | ICD-10-CM | POA: Diagnosis not present

## 2023-08-27 DIAGNOSIS — M1712 Unilateral primary osteoarthritis, left knee: Secondary | ICD-10-CM | POA: Diagnosis not present

## 2023-08-27 DIAGNOSIS — Z1339 Encounter for screening examination for other mental health and behavioral disorders: Secondary | ICD-10-CM | POA: Diagnosis not present

## 2023-08-27 DIAGNOSIS — Z79899 Other long term (current) drug therapy: Secondary | ICD-10-CM | POA: Diagnosis not present

## 2023-09-01 ENCOUNTER — Telehealth: Payer: Self-pay | Admitting: Orthopaedic Surgery

## 2023-09-01 NOTE — Telephone Encounter (Signed)
 Patient aware this is being mailed to her per her request

## 2023-09-01 NOTE — Telephone Encounter (Signed)
 Patient daughter called and wanted to know if he could fill  out another handicap form for her. It say if your 67 and older you don't have to renew but she said it said temporary instead of permanent. CB#[phone]

## 2023-09-11 ENCOUNTER — Other Ambulatory Visit: Payer: Self-pay | Admitting: Physician Assistant

## 2023-09-11 DIAGNOSIS — Z01818 Encounter for other preprocedural examination: Secondary | ICD-10-CM

## 2023-09-22 ENCOUNTER — Encounter (HOSPITAL_COMMUNITY): Payer: Self-pay

## 2023-09-22 NOTE — Pre-Procedure Instructions (Signed)
 Surgical Instructions   Your procedure is scheduled on September 29, 2023. Report to [facility] Main Entrance A at 6:45 A.M., then check in with the Admitting office. Any questions or running late day of surgery: call [phone]  Questions prior to your surgery date: call [phone], Monday-Friday, 8am-4pm. If you experience any cold or flu symptoms such as cough, fever, chills, shortness of breath, etc. between now and your scheduled surgery, please notify us  at the above number.     Remember:  Do not eat after midnight the night before your surgery  You may drink clear liquids until 5:45 AM the morning of your surgery.   Clear liquids allowed are: Water , Non-Citrus Juices (without pulp), Carbonated Beverages, Clear Tea (no milk, honey, etc.), Black Coffee Only (NO MILK, CREAM OR POWDERED CREAMER of any kind), and Gatorade.  Patient Instructions  The night before surgery:  No food after midnight. ONLY clear liquids after midnight  The day of surgery (if you do NOT have diabetes):  Drink ONE (1) Pre-Surgery Clear Ensure by 5:45 AM the morning of surgery. Drink in one sitting. Do not sip.  This drink was given to you during your hospital  pre-op appointment visit.  Nothing else to drink after completing the  Pre-Surgery Clear Ensure.         If you have questions, please contact your surgeon's office.    Take these medicines the morning of surgery with A SIP OF WATER : buPROPion  (WELLBUTRIN  XL)  escitalopram  (LEXAPRO )  famotidine (PEPCID)  LORazepam  (ATIVAN )  pantoprazole  (PROTONIX )  traMADol  (ULTRAM )  traZODone (DESYREL)    May take these medicines IF NEEDED: cetirizine (ZYRTEC)    Follow your surgeon's instructions on when to stop Aspirin .  If no instructions were given by your surgeon then you will need to call the office to get those instructions.     One week prior to surgery, STOP taking any Aleve, Naproxen, Ibuprofen, Motrin, Advil, Goody's, BC's, all  herbal medications, fish oil (Omega-3 Fatty Acids), and non-prescription vitamins. This includes your medication: celecoxib  (CELEBREX )                      Do NOT Smoke (Tobacco/Vaping) for 24 hours prior to your procedure.  If you use a CPAP at night, you may bring your mask/headgear for your overnight stay.   You will be asked to remove any contacts, glasses, piercing's, hearing aid's, dentures/partials prior to surgery. Please bring cases for these items if needed.    Patients discharged the day of surgery will not be allowed to drive home, and someone needs to stay with them for 24 hours.  SURGICAL WAITING ROOM VISITATION Patients may have no more than 2 support people in the waiting area - these visitors may rotate.   Pre-op nurse will coordinate an appropriate time for 1 ADULT support person, who may not rotate, to accompany patient in pre-op.  Children under the age of 1 must have an adult with them who is not the patient and must remain in the main waiting area with an adult.  If the patient needs to stay at the hospital during part of their recovery, the visitor guidelines for inpatient rooms apply.  Please refer to the [facility] website for the visitor guidelines for any additional information.   If you received a COVID test during your pre-op visit  it is requested that you wear a mask when out in public, stay away from anyone that may not be  feeling well and notify your surgeon if you develop symptoms. If you have been in contact with anyone that has tested positive in the last 10 days please notify you surgeon.      Pre-operative 5 CHG Bathing Instructions   You can play a key role in reducing the risk of infection after surgery. Your skin needs to be as free of germs as possible. You can reduce the number of germs on your skin by washing with CHG (chlorhexidine  gluconate) soap before surgery. CHG is an antiseptic soap that kills germs and continues to kill germs even  after washing.   DO NOT use if you have an allergy to chlorhexidine /CHG or antibacterial soaps. If your skin becomes reddened or irritated, stop using the CHG and notify one of our RNs at [phone].   Please shower with the CHG soap starting 4 days before surgery using the following schedule:     Please keep in mind the following:  DO NOT shave, including legs and underarms, starting the day of your first shower.   You may shave your face at any point before/day of surgery.  Place clean sheets on your bed the day you start using CHG soap. Use a clean washcloth (not used since being washed) for each shower. DO NOT sleep with pets once you start using the CHG.   CHG Shower Instructions:  Wash your face and private area with normal soap. If you choose to wash your hair, wash first with your normal shampoo.  After you use shampoo/soap, rinse your hair and body thoroughly to remove shampoo/soap residue.  Turn the water  OFF and apply about 3 tablespoons (45 ml) of CHG soap to a CLEAN washcloth.  Apply CHG soap ONLY FROM YOUR NECK DOWN TO YOUR TOES (washing for 3-5 minutes)  DO NOT use CHG soap on face, private areas, open wounds, or sores.  Pay special attention to the area where your surgery is being performed.  If you are having back surgery, having someone wash your back for you may be helpful. Wait 2 minutes after CHG soap is applied, then you may rinse off the CHG soap.  Pat dry with a clean towel  Put on clean clothes/pajamas   If you choose to wear lotion, please use ONLY the CHG-compatible lotions that are listed below.  Additional instructions for the day of surgery: DO NOT APPLY any lotions, deodorants, cologne, or perfumes.   Do not bring valuables to the hospital. [facility] is not responsible for any belongings/valuables. Do not wear nail polish, gel polish, artificial nails, or any other type of covering on natural nails (fingers and toes) Do not wear jewelry or  makeup Put on clean/comfortable clothes.  Please brush your teeth.  Ask your nurse before applying any prescription medications to the skin.     CHG Compatible Lotions   Aveeno Moisturizing lotion  Cetaphil Moisturizing Cream  Cetaphil Moisturizing Lotion  Clairol Herbal Essence Moisturizing Lotion, Dry Skin  Clairol Herbal Essence Moisturizing Lotion, Extra Dry Skin  Clairol Herbal Essence Moisturizing Lotion, Normal Skin  Curel Age Defying Therapeutic Moisturizing Lotion with Alpha Hydroxy  Curel Extreme Care Body Lotion  Curel Soothing Hands Moisturizing Hand Lotion  Curel Therapeutic Moisturizing Cream, Fragrance-Free  Curel Therapeutic Moisturizing Lotion, Fragrance-Free  Curel Therapeutic Moisturizing Lotion, Original Formula  Eucerin Daily Replenishing Lotion  Eucerin Dry Skin Therapy Plus Alpha Hydroxy Crme  Eucerin Dry Skin Therapy Plus Alpha Hydroxy Lotion  Eucerin Original Crme  Eucerin Original Lotion  Eucerin Plus Crme Eucerin Plus Lotion  Eucerin TriLipid Replenishing Lotion  Keri Anti-Bacterial Hand Lotion  Keri Deep Conditioning Original Lotion Dry Skin Formula Softly Scented  Keri Deep Conditioning Original Lotion, Fragrance Free Sensitive Skin Formula  Keri Lotion Fast Absorbing Fragrance Free Sensitive Skin Formula  Keri Lotion Fast Absorbing Softly Scented Dry Skin Formula  Keri Original Lotion  Keri Skin Renewal Lotion Keri Silky Smooth Lotion  Keri Silky Smooth Sensitive Skin Lotion  Nivea Body Creamy Conditioning Oil  Nivea Body Extra Enriched Lotion  Nivea Body Original Lotion  Nivea Body Sheer Moisturizing Lotion Nivea Crme  Nivea Skin Firming Lotion  NutraDerm 30 Skin Lotion  NutraDerm Skin Lotion  NutraDerm Therapeutic Skin Cream  NutraDerm Therapeutic Skin Lotion  ProShield Protective Hand Cream  Provon moisturizing lotion  Please read over the following fact sheets that you were given.

## 2023-09-23 ENCOUNTER — Other Ambulatory Visit: Payer: Self-pay

## 2023-09-23 ENCOUNTER — Encounter (HOSPITAL_COMMUNITY): Payer: Self-pay

## 2023-09-23 ENCOUNTER — Encounter (HOSPITAL_COMMUNITY)
Admission: RE | Admit: 2023-09-23 | Discharge: 2023-09-23 | Disposition: A | Discharge status: Home / Self Care | Source: Ambulatory Visit | Attending: Orthopaedic Surgery | Admitting: Orthopaedic Surgery

## 2023-09-23 VITALS — BP 154/99 | HR 98 | Temp 98.4°F | Resp 16 | Ht 67.0 in | Wt 141.3 lb

## 2023-09-23 DIAGNOSIS — Z01818 Encounter for other preprocedural examination: Secondary | ICD-10-CM | POA: Insufficient documentation

## 2023-09-23 DIAGNOSIS — I2510 Atherosclerotic heart disease of native coronary artery without angina pectoris: Secondary | ICD-10-CM | POA: Insufficient documentation

## 2023-09-23 HISTORY — DX: Depression, unspecified: F32.A

## 2023-09-23 HISTORY — DX: Cardiac arrhythmia, unspecified: I49.9

## 2023-09-23 HISTORY — DX: Family history of other specified conditions: Z84.89

## 2023-09-23 HISTORY — DX: Unspecified osteoarthritis, unspecified site: M19.90

## 2023-09-23 LAB — CBC
HCT: 38.1 % (ref 36.0–46.0)
Hemoglobin: 12.1 g/dL (ref 12.0–15.0)
MCH: 29 pg (ref 26.0–34.0)
MCHC: 31.8 g/dL (ref 30.0–36.0)
MCV: 91.4 fL (ref 80.0–100.0)
Platelets: 207 K/uL (ref 150–400)
RBC: 4.17 MIL/uL (ref 3.87–5.11)
RDW: 13.2 % (ref 11.5–15.5)
WBC: 7.1 K/uL (ref 4.0–10.5)
nRBC: 0 % (ref 0.0–0.2)

## 2023-09-23 LAB — TYPE AND SCREEN
ABO/RH(D): O POS
Antibody Screen: NEGATIVE

## 2023-09-23 LAB — BASIC METABOLIC PANEL WITH GFR
Anion gap: 8 (ref 5–15)
BUN: 19 mg/dL (ref 8–23)
CO2: 27 mmol/L (ref 22–32)
Calcium: 9.1 mg/dL (ref 8.9–10.3)
Chloride: 103 mmol/L (ref 98–111)
Creatinine, Ser: 0.96 mg/dL (ref 0.44–1.00)
GFR, Estimated: 58 mL/min — ABNORMAL LOW (ref >60)
Glucose, Bld: 90 mg/dL (ref 70–99)
Potassium: 4 mmol/L (ref 3.5–5.1)
Sodium: 138 mmol/L (ref 135–145)

## 2023-09-23 LAB — SURGICAL PCR SCREEN
MRSA, PCR: NEGATIVE
Staphylococcus aureus: NEGATIVE

## 2023-09-23 NOTE — Progress Notes (Signed)
 PCP - Dr. Delon Contes - [facility] in [place] Cardiologist - Pt saw Dr. Redell Leiter in 2019 with PRN follow-up  PPM/ICD - Denies Device Orders - n/a Rep Notified - n/a  Chest x-ray - n/a EKG - 09/23/2023 Stress Test - Denies ECHO - Denies Cardiac Cath - Denies  Sleep Study - Denies CPAP - n/a  No DM  Last dose of GLP1 agonist-  n/a GLP1 instructions: n/a  Blood Thinner Instructions: n/a Aspirin  Instructions: Pt instructed to contact surgeon's office for ASA instructions  ERAS Protcol - Clear liquids until 0545 morning of surgery PRE-SURGERY Ensure or G2- Ensure given to pt with instructions  COVID TEST- n/a   Anesthesia review: No  Patient denies shortness of breath, fever, cough and chest pain at PAT appointment. Pt denies any respiratory illness/infection in the last two months.   All instructions explained to the patient, with a verbal understanding of the material. Patient agrees to go over the instructions while at home for a better understanding. Patient also instructed to self quarantine after being tested for COVID-19. The opportunity to ask questions was provided.

## 2023-09-28 NOTE — H&P (Signed)
 TOTAL KNEE ADMISSION H&P  Patient is being admitted for left total knee arthroplasty.  Subjective:  Chief Complaint:left knee pain.  HPI: Penny Henry, 83 y.o. female, has a history of pain and functional disability in the left knee due to arthritis and has failed non-surgical conservative treatments for greater than 12 weeks to includeNSAID's and/or analgesics, corticosteriod injections, viscosupplementation injections, flexibility and strengthening excercises, and activity modification.  Onset of symptoms was gradual, starting several years ago with gradually worsening course since that time. The patient noted prior procedures on the knee to include  arthroscopy on the left knee(s).  Patient currently rates pain in the left knee(s) at 10 out of 10 with activity. Patient has night pain, worsening of pain with activity and weight bearing, pain that interferes with activities of daily living, pain with passive range of motion, crepitus, and joint swelling.  Patient has evidence of subchondral sclerosis, periarticular osteophytes, and joint space narrowing by imaging studies. There is no active infection.  Patient Active Problem List   Diagnosis Date Noted   Fall at home, initial encounter 11/28/2021   Leukocytosis 11/28/2021   GAD (generalized anxiety disorder) 11/28/2021   Closed displaced fracture of neck of left femur (HCC) 11/27/2021   Nontraumatic complete tear of left rotator cuff 12/08/2019   Chronic left shoulder pain 12/08/2019   Chronic pain of left knee 10/14/2017   Unilateral primary osteoarthritis, left knee 09/16/2017   Stasis dermatitis 09/16/2017   Allergic rhinitis 09/08/2017   PVC (premature ventricular contraction) 08/03/2014   SVT (supraventricular tachycardia) (HCC) 08/03/2014   Past Medical History:  Diagnosis Date   Acid reflux disease    Anxiety    Arthritis    Borderline hypercholesterolemia    Depression    Dysrhythmia    SVT/PVCs   Family history of  adverse reaction to anesthesia    Son wakes up during surgery   Insomnia    Irritable bowel syndrome     Past Surgical History:  Procedure Laterality Date   ANTERIOR FUSION CERVICAL SPINE  08/1999   Dr. Ozell Lyme at [facility], C3, C4, C5   APPENDECTOMY  06/06/1975   [facility]   CATARACT EXTRACTION W/ INTRAOCULAR LENS IMPLANT Left 03/21/2019   [facility] - Dr. Wanda   CATARACT EXTRACTION W/ INTRAOCULAR LENS IMPLANT Right 04/16/2020   [facility] - Dr. Wanda   KNEE ARTHROSCOPY Left 10/02/2011   Dr. Lonni Poli   LIPOMA EXCISION Left 04/10/2008   Left Upper Arm   TOTAL HIP ARTHROPLASTY Left 11/28/2021   Procedure: TOTAL HIP ARTHROPLASTY ANTERIOR APPROACH;  Surgeon: Poli Lonni GRADE, MD;  Location: [facility] OR;  Service: Orthopedics;  Laterality: Left;    No current facility-administered medications for this encounter.   Current Outpatient Medications  Medication Sig Dispense Refill Last Dose/Taking   aspirin  EC 81 MG tablet Take 81 mg by mouth daily. Swallow whole.   Taking   Bioflavonoid Products (ESTER C PO) Take 500 mg by mouth daily.   Taking   buPROPion  (WELLBUTRIN  XL) 150 MG 24 hr tablet Take 150 mg by mouth daily.   Taking   Calcium -Magnesium -Vitamin D  (CITRACAL CALCIUM  +D3) 600-40-500 MG-MG-UNIT TB24 Take 1 tablet by mouth daily.   Taking   celecoxib  (CELEBREX ) 200 MG capsule Take 1 capsule (200 mg total) by mouth 2 (two) times daily as needed. 60 capsule 2 Taking As Needed   cetirizine (ZYRTEC) 10 MG tablet Take 10 mg by mouth daily as needed for  allergies.   Taking As Needed   Cholecalciferol  (VITAMIN D -3) 5000 units TABS Take 5,000 Units by mouth daily.   Taking   Collagen-Vitamin C-Biotin (COLLAGEN PO) Take 2 Scoops by mouth daily. Collagen peptides   Taking   escitalopram  (LEXAPRO ) 20 MG tablet Take 20 mg by mouth daily.   Taking   famotidine  (PEPCID ) 20 MG tablet Take 20 mg by mouth daily.   Taking    FIBER ADULT GUMMIES PO Take 1 tablet by mouth daily.   Taking   LORazepam  (ATIVAN ) 1 MG tablet Take 1 mg by mouth 2 (two) times daily.  5 Taking   MAGNESIUM  PO Take 400 mg by mouth daily. Magnesium  Glycinate   Taking   montelukast  (SINGULAIR ) 10 MG tablet Take 10 mg by mouth at bedtime.   Taking   Multiple Vitamin (MULTI-VITAMINS PO) Take 1 tablet by mouth daily. Centrum Silver   Taking   Multiple Vitamins-Minerals (ICAPS AREDS 2 PO) Take 1 tablet by mouth daily.   Taking   Omega-3 Fatty Acids (FISH OIL) 1200 MG CPDR Take 1 tablet by mouth 2 (two) times daily.    Taking   OVER THE COUNTER MEDICATION Take 1 tablet by mouth daily. instaflex   Taking   OVER THE COUNTER MEDICATION Take 1 tablet by mouth daily. Osteo bi flex   Taking   pantoprazole  (PROTONIX ) 40 MG tablet Take 40 mg by mouth every morning.   Taking   polyethylene glycol (MIRALAX  / GLYCOLAX ) 17 g packet Take 17 g by mouth daily as needed for mild constipation. 14 each 0 Taking As Needed   traMADol  (ULTRAM ) 50 MG tablet Take 50 mg by mouth 2 (two) times daily.   Taking   traZODone (DESYREL) 50 MG tablet Take 50 mg by mouth daily. (Patient not taking: Reported on 09/23/2023)   Taking   VITAMIN A PO Take 3,000 mg by mouth daily.   Taking   zinc  gluconate 50 MG tablet Take 50 mg by mouth daily.   Taking   Allergies  Allergen Reactions   Doxycycline    Doxycycline Hyclate Hives    Other reaction(s): Other (See Comments) Unknown   Povidone Iodine Hives    Social History   Tobacco Use   Smoking status: Never   Smokeless tobacco: Never  Substance Use Topics   Alcohol use: Not Currently    Family History  Problem Relation Age of Onset   Arthritis Mother    Arthritis Father      Review of Systems  Objective:  Physical Exam Vitals reviewed.  Constitutional:      Appearance: Normal appearance. She is normal weight.  HENT:     Head: Normocephalic and atraumatic.  Eyes:     Extraocular Movements: Extraocular movements  intact.     Pupils: Pupils are equal, round, and reactive to light.  Cardiovascular:     Rate and Rhythm: Normal rate.  Pulmonary:     Effort: Pulmonary effort is normal.  Abdominal:     Palpations: Abdomen is soft.  Musculoskeletal:     Cervical back: Normal range of motion and neck supple.     Left knee: Effusion, bony tenderness and crepitus present. Decreased range of motion. Tenderness present over the medial joint line and lateral joint line. Abnormal meniscus.  Neurological:     Mental Status: She is alert and oriented to person, place, and time.  Psychiatric:        Behavior: Behavior normal.     Vital signs in  last 24 hours:    Labs:   Estimated body mass index is 22.13 kg/m as calculated from the following:   Height as of 09/23/23: 5' 7 (1.702 m).   Weight as of 09/23/23: 64.1 kg.   Imaging Review Plain radiographs demonstrate severe degenerative joint disease of the left knee(s). The overall alignment isneutral. The bone quality appears to be good for age and reported activity level.      Assessment/Plan:  End stage arthritis, left knee   The patient history, physical examination, clinical judgment of the provider and imaging studies are consistent with end stage degenerative joint disease of the left knee(s) and total knee arthroplasty is deemed medically necessary. The treatment options including medical management, injection therapy arthroscopy and arthroplasty were discussed at length. The risks and benefits of total knee arthroplasty were presented and reviewed. The risks due to aseptic loosening, infection, stiffness, patella tracking problems, thromboembolic complications and other imponderables were discussed. The patient acknowledged the explanation, agreed to proceed with the plan and consent was signed. Patient is being admitted for inpatient treatment for surgery, pain control, PT, OT, prophylactic antibiotics, VTE prophylaxis, progressive ambulation and  ADL's and discharge planning. The patient is planning to be discharged home with home health services

## 2023-09-28 NOTE — Anesthesia Preprocedure Evaluation (Signed)
 Anesthesia Evaluation  Patient identified by MRN, date of birth, ID band Patient awake    Reviewed: Allergy & Precautions, NPO status , Patient's Chart, lab work & pertinent test results  History of Anesthesia Complications Negative for: history of anesthetic complications  Airway Mallampati: II  TM Distance: >3 FB Neck ROM: Full    Dental no notable dental hx.    Pulmonary neg pulmonary ROS   Pulmonary exam normal        Cardiovascular Normal cardiovascular exam+ dysrhythmias Atrial Fibrillation      Neuro/Psych   Anxiety Depression       GI/Hepatic Neg liver ROS,GERD  Medicated,,  Endo/Other  negative endocrine ROS    Renal/GU negative Renal ROS     Musculoskeletal  (+) Arthritis ,    Abdominal   Peds  Hematology negative hematology ROS (+)   Anesthesia Other Findings Day of surgery medications reviewed with patient.  Reproductive/Obstetrics                              Anesthesia Physical Anesthesia Plan  ASA: 2  Anesthesia Plan: Spinal   Post-op Pain Management: Tylenol  PO (pre-op)* and Regional block*   Induction:   PONV Risk Score and Plan: 3 and Treatment may vary due to age or medical condition, Ondansetron , Propofol  infusion and Dexamethasone   Airway Management Planned: Natural Airway and Simple Face Mask  Additional Equipment: None  Intra-op Plan:   Post-operative Plan:   Informed Consent: I have reviewed the patients History and Physical, chart, labs and discussed the procedure including the risks, benefits and alternatives for the proposed anesthesia with the patient or authorized representative who has indicated his/her understanding and acceptance.       Plan Discussed with: CRNA  Anesthesia Plan Comments:          Anesthesia Quick Evaluation

## 2023-09-29 ENCOUNTER — Encounter (HOSPITAL_COMMUNITY): Payer: Self-pay | Admitting: Orthopaedic Surgery

## 2023-09-29 ENCOUNTER — Ambulatory Visit (HOSPITAL_BASED_OUTPATIENT_CLINIC_OR_DEPARTMENT_OTHER): Payer: Self-pay | Admitting: Anesthesiology

## 2023-09-29 ENCOUNTER — Ambulatory Visit (HOSPITAL_COMMUNITY): Payer: Self-pay | Admitting: Anesthesiology

## 2023-09-29 ENCOUNTER — Observation Stay (HOSPITAL_COMMUNITY)
Admission: RE | Admit: 2023-09-29 | Discharge: 2023-10-01 | Disposition: A | Discharge status: Home / Self Care | Attending: Orthopaedic Surgery | Admitting: Orthopaedic Surgery

## 2023-09-29 ENCOUNTER — Other Ambulatory Visit: Payer: Self-pay

## 2023-09-29 ENCOUNTER — Encounter (HOSPITAL_COMMUNITY)
Admission: RE | Disposition: A | Discharge status: Home / Self Care | Payer: Self-pay | Source: Home / Self Care | Attending: Orthopaedic Surgery

## 2023-09-29 ENCOUNTER — Observation Stay (HOSPITAL_COMMUNITY)

## 2023-09-29 DIAGNOSIS — M1712 Unilateral primary osteoarthritis, left knee: Principal | ICD-10-CM | POA: Diagnosis present

## 2023-09-29 DIAGNOSIS — Z96642 Presence of left artificial hip joint: Secondary | ICD-10-CM | POA: Diagnosis not present

## 2023-09-29 DIAGNOSIS — Z96652 Presence of left artificial knee joint: Secondary | ICD-10-CM | POA: Diagnosis not present

## 2023-09-29 DIAGNOSIS — M25562 Pain in left knee: Secondary | ICD-10-CM | POA: Diagnosis present

## 2023-09-29 DIAGNOSIS — G8918 Other acute postprocedural pain: Secondary | ICD-10-CM | POA: Diagnosis not present

## 2023-09-29 DIAGNOSIS — Z7982 Long term (current) use of aspirin: Secondary | ICD-10-CM | POA: Diagnosis not present

## 2023-09-29 DIAGNOSIS — R609 Edema, unspecified: Secondary | ICD-10-CM | POA: Diagnosis not present

## 2023-09-29 DIAGNOSIS — Z471 Aftercare following joint replacement surgery: Secondary | ICD-10-CM | POA: Diagnosis not present

## 2023-09-29 HISTORY — PX: TOTAL KNEE ARTHROPLASTY: SHX125

## 2023-09-29 SURGERY — ARTHROPLASTY, KNEE, TOTAL
Anesthesia: Monitor Anesthesia Care | Site: Knee | Laterality: Left

## 2023-09-29 MED ORDER — CLONIDINE HCL (ANALGESIA) 100 MCG/ML EP SOLN
EPIDURAL | Status: DC | PRN
  Administered 2023-09-29: 100 ug

## 2023-09-29 MED ORDER — OXYCODONE HCL 5 MG PO TABS
10.0000 mg | ORAL_TABLET | ORAL | Status: DC | PRN
  Administered 2023-09-29 – 2023-09-30 (×2): 10 mg via ORAL
  Filled 2023-09-29 (×2): qty 2

## 2023-09-29 MED ORDER — MONTELUKAST SODIUM 10 MG PO TABS
10.0000 mg | ORAL_TABLET | Freq: Every day | ORAL | Status: DC
  Administered 2023-09-29 – 2023-09-30 (×2): 10 mg via ORAL
  Filled 2023-09-29 (×2): qty 1

## 2023-09-29 MED ORDER — ONDANSETRON HCL 4 MG/2ML IJ SOLN
INTRAMUSCULAR | Status: DC | PRN
  Administered 2023-09-29: 4 mg via INTRAVENOUS

## 2023-09-29 MED ORDER — FENTANYL CITRATE (PF) 100 MCG/2ML IJ SOLN: 25.0000 ug | INTRAMUSCULAR | Status: DC | PRN

## 2023-09-29 MED ORDER — CEFAZOLIN SODIUM-DEXTROSE 2-4 GM/100ML-% IV SOLN
2.0000 g | INTRAVENOUS | Status: AC
  Administered 2023-09-29: 2 g via INTRAVENOUS
  Filled 2023-09-29: qty 100

## 2023-09-29 MED ORDER — METHOCARBAMOL 1000 MG/10ML IJ SOLN: 500.0000 mg | Freq: Four times a day (QID) | INTRAMUSCULAR | Status: DC | PRN

## 2023-09-29 MED ORDER — METOCLOPRAMIDE HCL 5 MG PO TABS: 5.0000 mg | ORAL_TABLET | Freq: Three times a day (TID) | ORAL | Status: DC | PRN

## 2023-09-29 MED ORDER — BUPROPION HCL ER (XL) 150 MG PO TB24
150.0000 mg | ORAL_TABLET | Freq: Every day | ORAL | Status: DC
  Administered 2023-09-30 – 2023-10-01 (×2): 150 mg via ORAL
  Filled 2023-09-29 (×3): qty 1

## 2023-09-29 MED ORDER — CEFAZOLIN SODIUM-DEXTROSE 2-4 GM/100ML-% IV SOLN
2.0000 g | Freq: Four times a day (QID) | INTRAVENOUS | Status: AC
  Administered 2023-09-29 (×2): 2 g via INTRAVENOUS
  Filled 2023-09-29 (×2): qty 100

## 2023-09-29 MED ORDER — ACETAMINOPHEN 325 MG PO TABS
325.0000 mg | ORAL_TABLET | Freq: Four times a day (QID) | ORAL | Status: DC | PRN
  Administered 2023-09-29 – 2023-09-30 (×2): 650 mg via ORAL
  Filled 2023-09-29 (×2): qty 2

## 2023-09-29 MED ORDER — LACTATED RINGERS IV SOLN: INTRAVENOUS | Status: DC

## 2023-09-29 MED ORDER — PANTOPRAZOLE SODIUM 40 MG PO TBEC
40.0000 mg | DELAYED_RELEASE_TABLET | Freq: Every morning | ORAL | Status: DC
Start: 2023-09-30 — End: 2023-10-01
  Administered 2023-09-30 – 2023-10-01 (×2): 40 mg via ORAL
  Filled 2023-09-29 (×2): qty 1

## 2023-09-29 MED ORDER — ALUM & MAG HYDROXIDE-SIMETH 200-200-20 MG/5ML PO SUSP: 30.0000 mL | ORAL | Status: DC | PRN

## 2023-09-29 MED ORDER — ONDANSETRON HCL 4 MG/2ML IJ SOLN
4.0000 mg | Freq: Four times a day (QID) | INTRAMUSCULAR | Status: DC | PRN
  Filled 2023-09-29: qty 2

## 2023-09-29 MED ORDER — POLYETHYLENE GLYCOL 3350 17 G PO PACK
17.0000 g | PACK | Freq: Every day | ORAL | Status: DC | PRN
Start: 2023-09-29 — End: 2023-10-01
  Administered 2023-09-30: 17 g via ORAL
  Filled 2023-09-29: qty 1

## 2023-09-29 MED ORDER — PHENOL 1.4 % MT LIQD: 1.0000 | OROMUCOSAL | Status: DC | PRN

## 2023-09-29 MED ORDER — TRANEXAMIC ACID-NACL 1000-0.7 MG/100ML-% IV SOLN
1000.0000 mg | INTRAVENOUS | Status: AC
  Administered 2023-09-29: 1000 mg via INTRAVENOUS
  Filled 2023-09-29: qty 100

## 2023-09-29 MED ORDER — PROPOFOL 500 MG/50ML IV EMUL
INTRAVENOUS | Status: DC | PRN
  Administered 2023-09-29: 125 ug/kg/min via INTRAVENOUS
  Administered 2023-09-29: 20 mg via INTRAVENOUS

## 2023-09-29 MED ORDER — OXYCODONE HCL 5 MG/5ML PO SOLN: 5.0000 mg | Freq: Once | ORAL | Status: DC | PRN

## 2023-09-29 MED ORDER — ASPIRIN 81 MG PO CHEW
81.0000 mg | CHEWABLE_TABLET | Freq: Two times a day (BID) | ORAL | Status: DC
  Administered 2023-09-29 – 2023-10-01 (×4): 81 mg via ORAL
  Filled 2023-09-29 (×4): qty 1

## 2023-09-29 MED ORDER — FENTANYL CITRATE (PF) 100 MCG/2ML IJ SOLN
INTRAMUSCULAR | Status: AC
  Administered 2023-09-29: 50 ug via INTRAVENOUS
  Filled 2023-09-29: qty 2

## 2023-09-29 MED ORDER — MENTHOL 3 MG MT LOZG: 1.0000 | LOZENGE | OROMUCOSAL | Status: DC | PRN

## 2023-09-29 MED ORDER — SODIUM CHLORIDE 0.9 % IV SOLN: INTRAVENOUS | Status: AC

## 2023-09-29 MED ORDER — METHOCARBAMOL 500 MG PO TABS
500.0000 mg | ORAL_TABLET | Freq: Four times a day (QID) | ORAL | Status: DC | PRN
  Administered 2023-09-29 – 2023-09-30 (×2): 500 mg via ORAL
  Filled 2023-09-29 (×3): qty 1

## 2023-09-29 MED ORDER — OXYCODONE HCL 5 MG PO TABS: 5.0000 mg | ORAL_TABLET | Freq: Once | ORAL | Status: DC | PRN

## 2023-09-29 MED ORDER — MIDAZOLAM HCL 2 MG/2ML IJ SOLN
INTRAMUSCULAR | Status: AC
  Filled 2023-09-29: qty 2

## 2023-09-29 MED ORDER — ONDANSETRON HCL 4 MG PO TABS: 4.0000 mg | ORAL_TABLET | Freq: Four times a day (QID) | ORAL | Status: DC | PRN

## 2023-09-29 MED ORDER — ORAL CARE MOUTH RINSE: 15.0000 mL | Freq: Once | OROMUCOSAL | Status: AC

## 2023-09-29 MED ORDER — LORAZEPAM 1 MG PO TABS
1.0000 mg | ORAL_TABLET | Freq: Two times a day (BID) | ORAL | Status: DC
  Administered 2023-09-29 – 2023-10-01 (×4): 1 mg via ORAL
  Filled 2023-09-29 (×4): qty 1

## 2023-09-29 MED ORDER — BUPIVACAINE-EPINEPHRINE (PF) 0.25% -1:200000 IJ SOLN
INTRAMUSCULAR | Status: AC
  Filled 2023-09-29: qty 30

## 2023-09-29 MED ORDER — ESCITALOPRAM OXALATE 10 MG PO TABS
20.0000 mg | ORAL_TABLET | Freq: Every day | ORAL | Status: DC
  Filled 2023-09-29: qty 2

## 2023-09-29 MED ORDER — OYSTER SHELL CALCIUM/D3 500-5 MG-MCG PO TABS
1.0000 | ORAL_TABLET | Freq: Every day | ORAL | Status: DC
  Administered 2023-09-29 – 2023-10-01 (×3): 1 via ORAL
  Filled 2023-09-29 (×3): qty 1

## 2023-09-29 MED ORDER — HYDROMORPHONE HCL 1 MG/ML IJ SOLN
0.5000 mg | INTRAMUSCULAR | Status: DC | PRN
  Administered 2023-09-30: 1 mg via INTRAVENOUS
  Filled 2023-09-29: qty 1

## 2023-09-29 MED ORDER — BUPIVACAINE-EPINEPHRINE (PF) 0.5% -1:200000 IJ SOLN
INTRAMUSCULAR | Status: DC | PRN
  Administered 2023-09-29: 15 mL via PERINEURAL

## 2023-09-29 MED ORDER — OXYCODONE HCL 5 MG PO TABS
5.0000 mg | ORAL_TABLET | ORAL | Status: DC | PRN
  Filled 2023-09-29: qty 2

## 2023-09-29 MED ORDER — DROPERIDOL 2.5 MG/ML IJ SOLN: 0.6250 mg | Freq: Once | INTRAMUSCULAR | Status: DC | PRN

## 2023-09-29 MED ORDER — CHLORHEXIDINE GLUCONATE 0.12 % MT SOLN
15.0000 mL | Freq: Once | OROMUCOSAL | Status: AC
  Administered 2023-09-29: 15 mL via OROMUCOSAL
  Filled 2023-09-29: qty 15

## 2023-09-29 MED ORDER — SODIUM CHLORIDE 0.9 % IR SOLN
Status: DC | PRN
  Administered 2023-09-29: 3000 mL

## 2023-09-29 MED ORDER — ESCITALOPRAM OXALATE 10 MG PO TABS
20.0000 mg | ORAL_TABLET | Freq: Every day | ORAL | Status: DC
  Administered 2023-09-29 – 2023-09-30 (×2): 20 mg via ORAL
  Filled 2023-09-29 (×2): qty 2

## 2023-09-29 MED ORDER — METOCLOPRAMIDE HCL 5 MG/ML IJ SOLN: 5.0000 mg | Freq: Three times a day (TID) | INTRAMUSCULAR | Status: DC | PRN

## 2023-09-29 MED ORDER — FAMOTIDINE 20 MG PO TABS
20.0000 mg | ORAL_TABLET | Freq: Every day | ORAL | Status: DC
  Filled 2023-09-29: qty 1

## 2023-09-29 MED ORDER — BUPIVACAINE IN DEXTROSE 0.75-8.25 % IT SOLN
INTRATHECAL | Status: DC | PRN
  Administered 2023-09-29: 1.6 mL via INTRATHECAL

## 2023-09-29 MED ORDER — BUPIVACAINE-EPINEPHRINE (PF) 0.25% -1:200000 IJ SOLN
INTRAMUSCULAR | Status: DC | PRN
  Administered 2023-09-29: 30 mL

## 2023-09-29 MED ORDER — ACETAMINOPHEN 500 MG PO TABS
1000.0000 mg | ORAL_TABLET | Freq: Once | ORAL | Status: AC
  Administered 2023-09-29: 1000 mg via ORAL
  Filled 2023-09-29: qty 2

## 2023-09-29 MED ORDER — DEXAMETHASONE SODIUM PHOSPHATE 10 MG/ML IJ SOLN
INTRAMUSCULAR | Status: DC | PRN
  Administered 2023-09-29: 5 mg via INTRAVENOUS

## 2023-09-29 MED ORDER — ZINC SULFATE 220 (50 ZN) MG PO CAPS
220.0000 mg | ORAL_CAPSULE | Freq: Every day | ORAL | Status: DC
  Administered 2023-09-29 – 2023-10-01 (×3): 220 mg via ORAL
  Filled 2023-09-29 (×3): qty 1

## 2023-09-29 MED ORDER — FENTANYL CITRATE (PF) 100 MCG/2ML IJ SOLN: 50.0000 ug | Freq: Once | INTRAMUSCULAR | Status: AC

## 2023-09-29 MED ORDER — VITAMIN D 25 MCG (1000 UNIT) PO TABS
5000.0000 [IU] | ORAL_TABLET | Freq: Every day | ORAL | Status: DC
  Administered 2023-09-30 – 2023-10-01 (×2): 5000 [IU] via ORAL
  Filled 2023-09-29 (×2): qty 5

## 2023-09-29 MED ORDER — DIPHENHYDRAMINE HCL 12.5 MG/5ML PO ELIX: 12.5000 mg | ORAL_SOLUTION | ORAL | Status: DC | PRN

## 2023-09-29 MED ORDER — FAMOTIDINE 20 MG PO TABS
20.0000 mg | ORAL_TABLET | Freq: Every day | ORAL | Status: DC
  Administered 2023-09-29 – 2023-09-30 (×2): 20 mg via ORAL
  Filled 2023-09-29 (×2): qty 1

## 2023-09-29 MED ORDER — PHENYLEPHRINE 80 MCG/ML (10ML) SYRINGE FOR IV PUSH (FOR BLOOD PRESSURE SUPPORT)
PREFILLED_SYRINGE | INTRAVENOUS | Status: DC | PRN
  Administered 2023-09-29: 80 ug via INTRAVENOUS
  Administered 2023-09-29: 160 ug via INTRAVENOUS

## 2023-09-29 MED ORDER — DOCUSATE SODIUM 100 MG PO CAPS
100.0000 mg | ORAL_CAPSULE | Freq: Two times a day (BID) | ORAL | Status: DC
  Administered 2023-09-29 – 2023-10-01 (×5): 100 mg via ORAL
  Filled 2023-09-29 (×5): qty 1

## 2023-09-29 MED ORDER — 0.9 % SODIUM CHLORIDE (POUR BTL) OPTIME
TOPICAL | Status: DC | PRN
  Administered 2023-09-29: 1000 mL

## 2023-09-29 SURGICAL SUPPLY — 59 items
BAG COUNTER SPONGE SURGICOUNT (BAG) ×1 IMPLANT
BANDAGE ESMARK 6X9 LF (GAUZE/BANDAGES/DRESSINGS) ×1 IMPLANT
BLADE SAG 18X100X1.27 (BLADE) ×1 IMPLANT
BLADE SAW SGTL 11.0X1.19X90.0M (BLADE) IMPLANT
BNDG ELASTIC 6INX 5YD STR LF (GAUZE/BANDAGES/DRESSINGS) ×2 IMPLANT
BOWL SMART MIX CTS (DISPOSABLE) IMPLANT
CEMENT BONE R 1X40 (Cement) IMPLANT
COMPONET TIB PS KNEE E 0D LT (Joint) IMPLANT
COOLER ICEMAN CLASSIC (MISCELLANEOUS) ×1 IMPLANT
COVER SURGICAL LIGHT HANDLE (MISCELLANEOUS) ×1 IMPLANT
CUFF TOURN SGL QUICK 42 (TOURNIQUET CUFF) IMPLANT
CUFF TRNQT CYL 34X4.125X (TOURNIQUET CUFF) ×1 IMPLANT
DRAPE EXTREMITY T 121X128X90 (DISPOSABLE) ×1 IMPLANT
DRAPE HALF SHEET 40X57 (DRAPES) ×1 IMPLANT
DRAPE INCISE 23X17 STRL (DRAPES) IMPLANT
DRAPE U-SHAPE 47X51 STRL (DRAPES) ×1 IMPLANT
DURAPREP 26ML APPLICATOR (WOUND CARE) ×1 IMPLANT
ELECT CAUTERY BLADE 6.4 (BLADE) ×1 IMPLANT
ELECTRODE REM PT RTRN 9FT ADLT (ELECTROSURGICAL) ×1 IMPLANT
FACESHIELD WRAPAROUND OR TEAM (MASK) ×2 IMPLANT
FEMUR CMTD CR STD SZ 6 LT KNEE (Joint) IMPLANT
GAUZE PAD ABD 8X10 STRL (GAUZE/BANDAGES/DRESSINGS) ×1 IMPLANT
GAUZE SPONGE 4X4 12PLY STRL (GAUZE/BANDAGES/DRESSINGS) ×1 IMPLANT
GAUZE XEROFORM 1X8 LF (GAUZE/BANDAGES/DRESSINGS) ×1 IMPLANT
GLOVE BIOGEL PI IND STRL 8 (GLOVE) ×2 IMPLANT
GLOVE ORTHO TXT STRL SZ7.5 (GLOVE) ×1 IMPLANT
GLOVE SURG ORTHO 8.0 STRL STRW (GLOVE) ×1 IMPLANT
GOWN STRL REUS W/ TWL LRG LVL3 (GOWN DISPOSABLE) IMPLANT
GOWN STRL REUS W/ TWL XL LVL3 (GOWN DISPOSABLE) ×2 IMPLANT
IMMOBILIZER KNEE 22 UNIV (SOFTGOODS) ×1 IMPLANT
IV NS 1000ML BAXH (IV SOLUTION) ×1 IMPLANT
KIT BASIN OR (CUSTOM PROCEDURE TRAY) ×1 IMPLANT
KIT TURNOVER KIT B (KITS) ×1 IMPLANT
MANIFOLD NEPTUNE II (INSTRUMENTS) ×1 IMPLANT
NDL 18GX1X1/2 (RX/OR ONLY) (NEEDLE) IMPLANT
NEEDLE 18GX1X1/2 (RX/OR ONLY) (NEEDLE) IMPLANT
NS IRRIG 1000ML POUR BTL (IV SOLUTION) ×1 IMPLANT
PACK TOTAL JOINT (CUSTOM PROCEDURE TRAY) ×1 IMPLANT
PAD ARMBOARD POSITIONER FOAM (MISCELLANEOUS) ×1 IMPLANT
PAD COLD SHLDR WRAP-ON (PAD) ×1 IMPLANT
PADDING CAST COTTON 6X4 STRL (CAST SUPPLIES) ×1 IMPLANT
PENCIL BUTTON HOLSTER BLD 10FT (ELECTRODE) IMPLANT
PIN DRILL HDLS TROCAR 75 4PK (PIN) IMPLANT
SCREW FEMALE HEX FIX 25X2.5 (ORTHOPEDIC DISPOSABLE SUPPLIES) IMPLANT
SET HNDPC FAN SPRY TIP SCT (DISPOSABLE) ×1 IMPLANT
SET PAD KNEE POSITIONER (MISCELLANEOUS) ×1 IMPLANT
STAPLER SKIN PROX 35W (STAPLE) ×1 IMPLANT
STEM POLY PAT PLY 32M KNEE (Knees) IMPLANT
STEM TIBIAL SZ6-7 EF 12 LT (Knees) IMPLANT
STEM TIBIAL SZ6-7 EF 14 LT (Joint) IMPLANT
SUCTION TUBE FRAZIER 10FR DISP (SUCTIONS) ×1 IMPLANT
SUT VIC AB 0 CT1 27XBRD ANBCTR (SUTURE) ×1 IMPLANT
SUT VIC AB 1 CT1 27XBRD ANBCTR (SUTURE) ×2 IMPLANT
SUT VIC AB 2-0 CT1 TAPERPNT 27 (SUTURE) ×2 IMPLANT
SYR 50ML LL SCALE MARK (SYRINGE) IMPLANT
SYR CONTROL 10ML LL (SYRINGE) ×1 IMPLANT
TOWEL GREEN STERILE (TOWEL DISPOSABLE) ×1 IMPLANT
TOWEL GREEN STERILE FF (TOWEL DISPOSABLE) ×1 IMPLANT
TRAY CATH INTERMITTENT SS 16FR (CATHETERS) IMPLANT

## 2023-09-29 NOTE — Transfer of Care (Signed)
 Immediate Anesthesia Transfer of Care Note  Patient: Penny Henry  Procedure(s) Performed: ARTHROPLASTY, KNEE, TOTAL (Left: Knee)  Patient Location: PACU  Anesthesia Type:MAC, Regional, and Spinal  Level of Consciousness: awake, drowsy, and patient cooperative  Airway & Oxygen Therapy: Patient Spontanous Breathing  Post-op Assessment: Report given to RN and Post -op Vital signs reviewed and stable  Post vital signs: Reviewed and stable  Last Vitals:  Vitals Value Taken Time  BP 104/68 09/29/23 10:51  Temp    Pulse 75 09/29/23 10:52  Resp 20 09/29/23 10:52  SpO2 96 % 09/29/23 10:52  Vitals shown include unfiled device data.  Last Pain:  Vitals:   09/29/23 0835  TempSrc:   PainSc: 0-No pain         Complications: No notable events documented.

## 2023-09-29 NOTE — Discharge Instructions (Signed)
 Per [facility] clinic policy, our goal is ensure optimal postoperative pain control with a multimodal pain management strategy. For all [facility] patients, our goal is to wean post-operative narcotic medications by 6 weeks post-operatively. If this is not possible due to utilization of pain medication prior to surgery, your [facility] doctor will support your acute post-operative pain control for the first 6 weeks postoperatively, with a plan to transition you back to your primary pain team following that. Penny Henry will work to ensure a Therapist, occupational.  INSTRUCTIONS AFTER JOINT REPLACEMENT   Remove items at home which could result in a fall. This includes throw rugs or furniture in walking pathways ICE to the affected joint every three hours while awake for 30 minutes at a time, for at least the first 3-5 days, and then as needed for pain and swelling.  Continue to use ice for pain and swelling. You may notice swelling that will progress down to the foot and ankle.  This is normal after surgery.  Elevate your leg when you are not up walking on it.   Continue to use the breathing machine you got in the hospital (incentive spirometer) which will help keep your temperature down.  It is common for your temperature to cycle up and down following surgery, especially at night when you are not up moving around and exerting yourself.  The breathing machine keeps your lungs expanded and your temperature down.   DIET:  As you were doing prior to hospitalization, we recommend a well-balanced diet.  DRESSING / WOUND CARE / SHOWERING  Keep the surgical dressing until follow up.  The dressing is water  proof, so you can shower without any extra covering.  IF THE DRESSING FALLS OFF or the wound gets wet inside, change the dressing with sterile gauze.  Please use good hand washing techniques before changing the dressing.  Do not use any lotions or creams on the incision until instructed by your surgeon.     ACTIVITY  Increase activity slowly as tolerated, but follow the weight bearing instructions below.   No driving for 6 weeks or until further direction given by your physician.  You cannot drive while taking narcotics.  No lifting or carrying greater than 10 lbs. until further directed by your surgeon. Avoid periods of inactivity such as sitting longer than an hour when not asleep. This helps prevent blood clots.  You may return to work once you are authorized by your doctor.     WEIGHT BEARING   Weight bearing as tolerated with assist device (walker, cane, etc) as directed, use it as long as suggested by your surgeon or therapist, typically at least 4-6 weeks.   EXERCISES  Results after joint replacement surgery are often greatly improved when you follow the exercise, range of motion and muscle strengthening exercises prescribed by your doctor. Safety measures are also important to protect the joint from further injury. Any time any of these exercises cause you to have increased pain or swelling, decrease what you are doing until you are comfortable again and then slowly increase them. If you have problems or questions, call your caregiver or physical therapist for advice.   Rehabilitation is important following a joint replacement. After just a few days of immobilization, the muscles of the leg can become weakened and shrink (atrophy).  These exercises are designed to build up the tone and strength of the thigh and leg muscles and to improve motion. Often times heat used for twenty to thirty minutes before  working out will loosen up your tissues and help with improving the range of motion but do not use heat for the first two weeks following surgery (sometimes heat can increase post-operative swelling).   These exercises can be done on a training (exercise) mat, on the floor, on a table or on a bed. Use whatever works the best and is most comfortable for you.    Use music or television  while you are exercising so that the exercises are a pleasant break in your day. This will make your life better with the exercises acting as a break in your routine that you can look forward to.   Perform all exercises about fifteen times, three times per day or as directed.  You should exercise both the operative leg and the other leg as well.  Exercises include:   Quad Sets - Tighten up the muscle on the front of the thigh (Quad) and hold for 5-10 seconds.   Straight Leg Raises - With your knee straight (if you were given a brace, keep it on), lift the leg to 60 degrees, hold for 3 seconds, and slowly lower the leg.  Perform this exercise against resistance later as your leg gets stronger.  Leg Slides: Lying on your back, slowly slide your foot toward your buttocks, bending your knee up off the floor (only go as far as is comfortable). Then slowly slide your foot back down until your leg is flat on the floor again.  Angel Wings: Lying on your back spread your legs to the side as far apart as you can without causing discomfort.  Hamstring Strength:  Lying on your back, push your heel against the floor with your leg straight by tightening up the muscles of your buttocks.  Repeat, but this time bend your knee to a comfortable angle, and push your heel against the floor.  You may put a pillow under the heel to make it more comfortable if necessary.   A rehabilitation program following joint replacement surgery can speed recovery and prevent re-injury in the future due to weakened muscles. Contact your doctor or a physical therapist for more information on knee rehabilitation.    CONSTIPATION  Constipation is defined medically as fewer than three stools per week and severe constipation as less than one stool per week.  Even if you have a regular bowel pattern at home, your normal regimen is likely to be disrupted due to multiple reasons following surgery.  Combination of anesthesia, postoperative  narcotics, change in appetite and fluid intake all can affect your bowels.   YOU MUST use at least one of the following options; they are listed in order of increasing strength to get the job done.  They are all available over the counter, and you may need to use some, POSSIBLY even all of these options:    Drink plenty of fluids (prune juice may be helpful) and high fiber foods Colace 100 mg by mouth twice a day  Senokot for constipation as directed and as needed Dulcolax (bisacodyl), take with full glass of water  Miralax (polyethylene glycol) once or twice a day as needed.  If you have tried all these things and are unable to have a bowel movement in the first 3-4 days after surgery call either your surgeon or your primary doctor.    If you experience loose stools or diarrhea, hold the medications until you stool forms back up.  If your symptoms do not get better within 1 week  or if they get worse, check with your doctor.  If you experience the worst abdominal pain ever or develop nausea or vomiting, please contact the office immediately for further recommendations for treatment.   ITCHING:  If you experience itching with your medications, try taking only a single pain pill, or even half a pain pill at a time.  You can also use Benadryl  over the counter for itching or also to help with sleep.   TED HOSE STOCKINGS:  Use stockings on both legs until for at least 2 weeks or as directed by physician office. They may be removed at night for sleeping.  MEDICATIONS:  See your medication summary on the "After Visit Summary" that nursing will review with you.  You may have some home medications which will be placed on hold until you complete the course of blood thinner medication.  It is important for you to complete the blood thinner medication as prescribed.  PRECAUTIONS:  If you experience chest pain or shortness of breath - call 911 immediately for transfer to the hospital emergency department.    If you develop a fever greater that 101 F, purulent drainage from wound, increased redness or drainage from wound, foul odor from the wound/dressing, or calf pain - CONTACT YOUR SURGEON.                                                   FOLLOW-UP APPOINTMENTS:  If you do not already have a post-op appointment, please call the office for an appointment to be seen by your surgeon.  Guidelines for how soon to be seen are listed in your "After Visit Summary", but are typically between 1-4 weeks after surgery.  OTHER INSTRUCTIONS:   Knee Replacement:  Do not place pillow under knee, focus on keeping the knee straight while resting. CPM instructions: 0-90 degrees, 2 hours in the morning, 2 hours in the afternoon, and 2 hours in the evening. Place foam block, curve side up under heel at all times except when in CPM or when walking.  DO NOT modify, tear, cut, or change the foam block in any way.  POST-OPERATIVE OPIOID TAPER INSTRUCTIONS: It is important to wean off of your opioid medication as soon as possible. If you do not need pain medication after your surgery it is ok to stop day one. Opioids include: Codeine, Hydrocodone(Norco, Vicodin), Oxycodone (Percocet, oxycontin ) and hydromorphone  amongst others.  Long term and even short term use of opiods can cause: Increased pain response Dependence Constipation Depression Respiratory depression And more.  Withdrawal symptoms can include Flu like symptoms Nausea, vomiting And more Techniques to manage these symptoms Hydrate well Eat regular healthy meals Stay active Use relaxation techniques(deep breathing, meditating, yoga) Do Not substitute Alcohol to help with tapering If you have been on opioids for less than two weeks and do not have pain than it is ok to stop all together.  Plan to wean off of opioids This plan should start within one week post op of your joint replacement. Maintain the same interval or time between taking each dose  and first decrease the dose.  Cut the total daily intake of opioids by one tablet each day Next start to increase the time between doses. The last dose that should be eliminated is the evening dose.   MAKE SURE YOU:  Understand these instructions.  Get help right away if you are not doing well or get worse.    Thank you for letting us  be a part of your medical care team.  It is a privilege we respect greatly.  We hope these instructions will help you stay on track for a fast and full recovery!     Dental Antibiotics:  In most cases prophylactic antibiotics for Dental procdeures after total joint surgery are not necessary.  Exceptions are as follows:  1. History of prior total joint infection  2. Severely immunocompromised (Organ Transplant, cancer chemotherapy, Rheumatoid biologic meds such as Humera)  3. Poorly controlled diabetes (A1C &gt; 8.0, blood glucose over 200)  If you have one of these conditions, contact your surgeon for an antibiotic prescription, prior to your dental procedure.

## 2023-09-29 NOTE — Anesthesia Procedure Notes (Addendum)
 Spinal  Patient location during procedure: OR Start time: 09/29/2023 9:04 AM End time: 09/29/2023 9:08 AM Reason for block: surgical anesthesia Staffing Performed: anesthesiologist  Anesthesiologist: Darlyn Rush, MD Performed by: Darlyn Rush, MD Authorized by: Paul Lamarr BRAVO, MD   Preanesthetic Checklist Completed: patient identified, IV checked, site marked, risks and benefits discussed, surgical consent, monitors and equipment checked, pre-op evaluation and timeout performed Spinal Block Patient position: sitting Prep: ChloraPrep and site prepped and draped Patient monitoring: heart rate, continuous pulse ox and blood pressure Approach: right paramedian Location: L4-5 Injection technique: single-shot Needle Needle type: Spinocan  Needle gauge: 25 G Needle length: 9 cm Additional Notes Expiration date of kit checked and confirmed. Patient tolerated procedure well, without complications.

## 2023-09-29 NOTE — Evaluation (Signed)
 Physical Therapy Evaluation Patient Details Name: Penny Henry MRN: [id number] DOB: 07/14/1940 Today's Date: 09/29/2023  History of Present Illness  83 y.o. female presents to [facility] hospital on 09/29/2023 for elective L TKA. PMH includes SVT, GAD, chronic L shoulder pain.  Clinical Impression  Pt presents to PT with deficits in functional mobility, gait, balance, strength, ROM. Pt is able to ambulate for household distances with support of the RW. PT provides education on the TKR exercise packet and encourages frequent mobilization with staff assistance. PT will follow up tomorrow for a progression of gait and to initiate stair training.        If plan is discharge home, recommend the following: A little help with walking and/or transfers;A little help with bathing/dressing/bathroom;Assistance with cooking/housework;Assist for transportation;Help with stairs or ramp for entrance   Can travel by private vehicle        Equipment Recommendations None recommended by PT  Recommendations for Other Services       Functional Status Assessment Patient has had a recent decline in their functional status and demonstrates the ability to make significant improvements in function in a reasonable and predictable amount of time.     Precautions / Restrictions Precautions Precautions: Fall;Knee Precaution Booklet Issued: Yes (comment) Recall of Precautions/Restrictions: Intact Restrictions Weight Bearing Restrictions Per Provider Order: Yes LLE Weight Bearing Per Provider Order: Weight bearing as tolerated      Mobility  Bed Mobility Overal bed mobility: Needs Assistance Bed Mobility: Supine to Sit     Supine to sit: Supervision, HOB elevated          Transfers Overall transfer level: Needs assistance Equipment used: Rolling walker (2 wheels) Transfers: Sit to/from Stand Sit to Stand: Contact guard assist                Ambulation/Gait Ambulation/Gait assistance: Contact  guard assist Gait Distance (Feet): 150 Feet Assistive device: Rolling walker (2 wheels) Gait Pattern/deviations: Step-through pattern Gait velocity: reduced Gait velocity interpretation: <1.8 ft/sec, indicate of risk for recurrent falls   General Gait Details: slowed step-through gait, verbal cues to maintain RW closer to BOS to avoid trunk flexion  Stairs            Wheelchair Mobility     Tilt Bed    Modified Rankin (Stroke Patients Only)       Balance Overall balance assessment: Needs assistance Sitting-balance support: No upper extremity supported, Feet supported Sitting balance-Leahy Scale: Good     Standing balance support: Single extremity supported, Reliant on assistive device for balance Standing balance-Leahy Scale: Poor                               Pertinent Vitals/Pain Pain Assessment Pain Assessment: 0-10 Pain Score: 5  Pain Location: L knee Pain Descriptors / Indicators: Aching Pain Intervention(s): Monitored during session    Home Living Family/patient expects to be discharged to:: Private residence Living Arrangements: Alone Available Help at Discharge: Family;Available 24 hours/day Type of Home: House Home Access: Stairs to enter Entrance Stairs-Rails: Can reach both Entrance Stairs-Number of Steps: 2   Home Layout: Able to live on main level with bedroom/bathroom Home Equipment: Agricultural consultant (2 wheels);Cane - single point;BSC/3in1;Tub bench;Hand held shower head      Prior Function Prior Level of Function : Independent/Modified Independent             Mobility Comments: ambulatory without DME at baseline, utilizing a cane  recently prior to surgery due to knee instability       Extremity/Trunk Assessment   Upper Extremity Assessment Upper Extremity Assessment: Overall WFL for tasks assessed    Lower Extremity Assessment Lower Extremity Assessment: LLE deficits/detail LLE Deficits / Details: post-op ROM and  strength deficits as anticipated on POD 0 s/p TKA    Cervical / Trunk Assessment Cervical / Trunk Assessment: Normal  Communication   Communication Communication: No apparent difficulties    Cognition Arousal: Alert Behavior During Therapy: WFL for tasks assessed/performed   PT - Cognitive impairments: No apparent impairments                         Following commands: Intact       Cueing Cueing Techniques: Verbal cues     General Comments General comments (skin integrity, edema, etc.): VSS on RA    Exercises Other Exercises Other Exercises: PT provides education on TKR exercise packet   Assessment/Plan    PT Assessment Patient needs continued PT services  PT Problem List Decreased strength;Decreased range of motion;Decreased activity tolerance;Decreased balance;Decreased mobility;Decreased knowledge of use of DME;Pain       PT Treatment Interventions DME instruction;Gait training;Stair training;Functional mobility training;Therapeutic activities;Therapeutic exercise;Balance training;Neuromuscular re-education;Patient/family education    PT Goals (Current goals can be found in the Care Plan section)  Acute Rehab PT Goals Patient Stated Goal: to return to independence PT Goal Formulation: With patient Time For Goal Achievement: 10/03/23 Potential to Achieve Goals: Good    Frequency 7X/week     Co-evaluation               AM-PAC PT 6 Clicks Mobility  Outcome Measure Help needed turning from your back to your side while in a flat bed without using bedrails?: A Little Help needed moving from lying on your back to sitting on the side of a flat bed without using bedrails?: A Little Help needed moving to and from a bed to a chair (including a wheelchair)?: A Little Help needed standing up from a chair using your arms (e.g., wheelchair or bedside chair)?: A Little Help needed to walk in hospital room?: A Little Help needed climbing 3-5 steps with a  railing? : Total 6 Click Score: 16    End of Session Equipment Utilized During Treatment: Gait belt Activity Tolerance: Patient tolerated treatment well Patient left: in chair;with call bell/phone within reach;with family/visitor present Nurse Communication: Mobility status PT Visit Diagnosis: Other abnormalities of gait and mobility (R26.89);Muscle weakness (generalized) (M62.81);Pain Pain - Right/Left: Left Pain - part of body: Knee    Time: 8573-8548 PT Time Calculation (min) (ACUTE ONLY): 25 min   Charges:   PT Evaluation $PT Eval Low Complexity: 1 Low   PT General Charges $$ ACUTE PT VISIT: 1 Visit         Bernardino JINNY Ruth, PT, DPT Acute Rehabilitation Office [phone]   Bernardino JINNY Ruth 09/29/2023, 3:46 PM

## 2023-09-29 NOTE — Anesthesia Procedure Notes (Addendum)
 Anesthesia Regional Block: Adductor canal block   Pre-Anesthetic Checklist: , timeout performed,  Correct Patient, Correct Site, Correct Laterality,  Correct Procedure, Correct Position, site marked,  Risks and benefits discussed,  Pre-op evaluation,  At surgeon's request and post-op pain management  Laterality: Left  Prep: Maximum Sterile Barrier Precautions used, chloraprep       Needles:  Injection technique: Single-shot  Needle Type: Echogenic Stimulator Needle     Needle Length: 9cm  Needle Gauge: 22     Additional Needles:   Procedures:,,,, ultrasound used (permanent image in chart),,    Narrative:  Start time: 09/29/2023 8:22 AM End time: 09/29/2023 8:25 AM Injection made incrementally with aspirations every 5 mL.  Performed by: Personally  Anesthesiologist: Paul Lamarr BRAVO, MD  Additional Notes: Risks, benefits, and alternative discussed. Patient gave consent for procedure. Patient prepped and draped in sterile fashion. Sedation administered, patient remains easily responsive to voice. Relevant anatomy identified with ultrasound guidance. Local anesthetic given in 5cc increments with no signs or symptoms of intravascular injection. No pain or paraesthesias with injection. Patient monitored throughout procedure with signs of LAST or immediate complications. Tolerated well. Ultrasound image placed in chart.  LANEY Paul, MD

## 2023-09-29 NOTE — Interval H&P Note (Signed)
 History and Physical Interval Note: The patient understands that she is here today for a left total knee replacement to treat her significant left knee pain and arthritis.  There has been no acute or interval change in medical status.  The risks and benefits of surgery have been discussed in detail and informed consent has been obtained.  The left operative knee has been marked.  09/29/2023 7:57 AM  Penny Henry  has presented today for surgery, with the diagnosis of osteoarthritis left knee.  The various methods of treatment have been discussed with the patient and family. After consideration of risks, benefits and other options for treatment, the patient has consented to  Procedure(s): ARTHROPLASTY, KNEE, TOTAL (Left) as a surgical intervention.  The patient's history has been reviewed, patient examined, no change in status, stable for surgery.  I have reviewed the patient's chart and labs.  Questions were answered to the patient's satisfaction.     Lonni CINDERELLA Poli

## 2023-09-29 NOTE — Progress Notes (Signed)
 Patient arrived to unit from PACU. She is alert, oriented, on room air, daughter is at bedside, iceman with pt, knee immobilizer on, she was oriented to room, bed in low position, wheels locked, bed alarm on, and call bell within reach. IS given, scds placed on pt.

## 2023-09-29 NOTE — Anesthesia Postprocedure Evaluation (Signed)
 Anesthesia Post Note  Patient: Penny Henry  Procedure(s) Performed: ARTHROPLASTY, KNEE, TOTAL (Left: Knee)     Patient location during evaluation: PACU Anesthesia Type: Spinal Level of consciousness: awake and alert Pain management: pain level controlled Vital Signs Assessment: post-procedure vital signs reviewed and stable Respiratory status: spontaneous breathing, nonlabored ventilation and respiratory function stable Cardiovascular status: blood pressure returned to baseline Postop Assessment: no apparent nausea or vomiting, spinal receding, no headache and no backache Anesthetic complications: no   No notable events documented.  Last Vitals:  Vitals:   09/29/23 1130 09/29/23 1145  BP: (!) 156/73 116/78  Pulse: 72 69  Resp: 12 20  Temp:    SpO2: 96% 98%    Last Pain:  Vitals:   09/29/23 1145  TempSrc:   PainSc: 0-No pain                 Vertell Row

## 2023-09-29 NOTE — Op Note (Addendum)
 Operative Note  Date of operation: 09/29/2023 Preoperative diagnosis: Left knee primary osteoarthritis Postoperative diagnosis: Same  Procedure: Left cemented total knee arthroplasty  Implants: Biomet/Zimmer persona cemented knee system Implant Name Type Inv. Item Serial No. Manufacturer Lot No. LRB No. Used Action  CEMENT BONE R 1X40 - [id number] Cement CEMENT BONE R 1X40  ZIMMER RECON(ORTH,TRAU,BIO,SG) [id number] Left 1 Implanted  CEMENT BONE R 1X40 - [id number] Cement CEMENT BONE R 1X40  ZIMMER RECON(ORTH,TRAU,BIO,SG) [id number] Left 1 Implanted  FEMUR CMTD CR STD SZ 6 LT KNEE - [id number] Joint FEMUR CMTD CR STD SZ 6 LT KNEE  ZIMMER RECON(ORTH,TRAU,BIO,SG) [id number] Left 1 Implanted  STEM POLY PAT PLY 50M KNEE - [id number] Knees STEM POLY PAT PLY 50M KNEE  ZIMMER RECON(ORTH,TRAU,BIO,SG) [id number] Left 1 Implanted  STEM TIBIAL SZ6-7 EF 12 LT - [id number] Knees STEM TIBIAL SZ6-7 EF 12 LT  ZIMMER RECON(ORTH,TRAU,BIO,SG) [id number] Left 1 Implanted  COMPONET TIB PS KNEE E 0D LT - [id number] Joint COMPONET TIB PS KNEE E 0D LT  ZIMMER RECON(ORTH,TRAU,BIO,SG) [id number] Left 1 Implanted  STEM TIBIAL SZ6-7 EF 14 LT - [id number] Joint STEM TIBIAL SZ6-7 EF 14 LT  ZIMMER RECON(ORTH,TRAU,BIO,SG) [id number] Left 1 Implanted   Surgeon: Lonni GRADE. Vernetta, MD Assistant: Tory Gaskins, PA-C  Anesthesia: #1 left lower extremity adductor canal block, #2 spinal, #3 local Tourniquet time: Less than 1 hour EBL: Less than 50 cc Complications: None  Indications: The patient is a 83 year old female well-known to us .  We have been seeing her for many years now due to debilitating arthritis involving her left knee.  She has tried and failed all forms of conservative treatment for the left knee and is even had arthroscopic surgery on the knee remotely in the past.  At this point her left knee pain has become daily and is definitely affecting her mobility, her quality of life and her actives day living to the point she  does wish to proceed with a knee replacement and we agree with this as well.  We did discuss the risks of acute blood loss anemia, nerve and vascular injury, fracture, infection, DVT, implant failure and wound healing issues.  She understands that our goals are hopefully decreased pain, improved mobility and improved quality of life.  Procedure description: After informed consent was obtained and the appropriate left knee was marked, anesthesia obtained a left lower extremity adductor canal block in the holding room.  The patient was then brought to the operating room and set up on the operating table where spinal anesthesia was obtained.  She was then laid in supine position on the operating table and a nonsterile tourniquet was placed around her upper left thigh.  Her left thigh, knee, leg, and ankle were prepped and draped with ChloraPrep and sterile drapes including a sterile stockinette.  A timeout was called and she is identified as the correct patient to correct the left knee.  An Esmarch was then used to wrap of the leg and the tourniquet was inflated to 300 mm pressure.  With the knee in extended position a direct midline incision was made over the knee and carried proximally distally.  Dissection was carried down to the knee joint and a medial parapatellar arthrotomy is made finding a large joint effusion.  With the knee in a flexed position we deafly found severe and significant cartilage wear on the lateral compartment of the knee as well as moderate cartilage loss throughout.  The knee had some valgus malalignment as well.  We remove remnants  of the ACL and medial lateral meniscus.  We then used an extramedullary based cutting guide for making our proximal tibia cut correcting for varus and valgus and a 7 degree slope.  We made this cut to take 2 mm off the low side.  We then used an intramedullary based cutting guide for making our distal femur cut setting this for a left knee at 5 degrees externally  rotated and made our distal femur cut for 10 mm without difficulty.  I brought the knee back down to full extension and actually achieve full extension with a 12 mm block.  We then impacted the femur and with the knee in the flexed position but the femoral sizing guide based off the epicondylar axis.  Based off of this we chose a size 6 femur.  We put a 4-in-1 cutting block for a size 6 femur and made our anterior and posterior cuts followed by the chamfer cuts.  We then elected tibia and chose a size E left tibial tray for coverage over the tibial plateau segment rotation off the tibial tubercle and the femur.  We did a drill hole and keel punch over this.  We then trialed our size E left tibia combined with our size 6 left CR standard femur.  We trialed our 12 mm thickness left medial congruent poly and ensure we are pleased with range of motion and stability without insert.  We then made a patella cut and drilled 3 holes for a size 32 patella button.  Again we were pleased with stability with those trial implants.  We then removed all the trial implants and irrigated the knee with normal saline solution.  We then placed Marcaine  with epinephrine  around the arthrotomy.  Next the cement was mixed and we cemented our Biomet/Zimmer persona tibial tray left knee size E followed by cementing our size 6 left CR standard femur.  We placed our 12 mm thickness medial congruent polythene insert and cemented our size 32 patella button.  We then held the knee fully extended and compressed while the cemented hardened.  Once it hardened we did assess the stability of the knee and at that point I felt like we actually needed a thicker poly liner.  We removed the 12 mm thickness polythene insert and went up to a size 14 mm left medial congruent polyethylene implant and we are pleased with the range of motion and stability with that implant.  We then let the tourniquet down and hemostasis was obtained with electrocautery.  The  arthrotomy was closed with interrupted #1 Vicryl suture followed by 0 Vicryl because the deep tissue and 2-0 Vicryl to close the subcutaneous tissue.  The skin was closed with staples.  Well-padded sterile dressing was applied.  The patient was then taken the recovery room.  Tory Gaskins, PA-C did assist in the entire case and beginning to end and his assistance was crucial and medically necessary for soft tissue management and retraction, helping guide implant placement and a layered closure of the wound.

## 2023-09-30 ENCOUNTER — Encounter (HOSPITAL_COMMUNITY): Payer: Self-pay | Admitting: Orthopaedic Surgery

## 2023-09-30 DIAGNOSIS — M1712 Unilateral primary osteoarthritis, left knee: Secondary | ICD-10-CM | POA: Diagnosis not present

## 2023-09-30 DIAGNOSIS — Z96642 Presence of left artificial hip joint: Secondary | ICD-10-CM | POA: Diagnosis not present

## 2023-09-30 DIAGNOSIS — Z7982 Long term (current) use of aspirin: Secondary | ICD-10-CM | POA: Diagnosis not present

## 2023-09-30 LAB — BASIC METABOLIC PANEL WITH GFR
Anion gap: 10 (ref 5–15)
BUN: 9 mg/dL (ref 8–23)
CO2: 26 mmol/L (ref 22–32)
Calcium: 8.5 mg/dL — ABNORMAL LOW (ref 8.9–10.3)
Chloride: 102 mmol/L (ref 98–111)
Creatinine, Ser: 0.8 mg/dL (ref 0.44–1.00)
GFR, Estimated: >60 mL/min (ref >60)
Glucose, Bld: 142 mg/dL — ABNORMAL HIGH (ref 70–99)
Potassium: 4.1 mmol/L (ref 3.5–5.1)
Sodium: 138 mmol/L (ref 135–145)

## 2023-09-30 LAB — CBC
HCT: 29.9 % — ABNORMAL LOW (ref 36.0–46.0)
Hemoglobin: 9.8 g/dL — ABNORMAL LOW (ref 12.0–15.0)
MCH: 29.3 pg (ref 26.0–34.0)
MCHC: 32.8 g/dL (ref 30.0–36.0)
MCV: 89.3 fL (ref 80.0–100.0)
Platelets: 178 K/uL (ref 150–400)
RBC: 3.35 MIL/uL — ABNORMAL LOW (ref 3.87–5.11)
RDW: 13.2 % (ref 11.5–15.5)
WBC: 8.7 K/uL (ref 4.0–10.5)
nRBC: 0 % (ref 0.0–0.2)

## 2023-09-30 MED ORDER — TRAMADOL HCL 50 MG PO TABS: 50.0000 mg | ORAL_TABLET | Freq: Four times a day (QID) | ORAL | Status: DC

## 2023-09-30 MED ORDER — TRAMADOL HCL 50 MG PO TABS
50.0000 mg | ORAL_TABLET | Freq: Four times a day (QID) | ORAL | Status: DC | PRN
  Administered 2023-09-30 – 2023-10-01 (×2): 50 mg via ORAL
  Filled 2023-09-30 (×2): qty 1

## 2023-09-30 MED ORDER — SODIUM CHLORIDE 0.9 % IV BOLUS
500.0000 mL | Freq: Once | INTRAVENOUS | Status: AC
  Administered 2023-09-30: 500 mL via INTRAVENOUS

## 2023-09-30 NOTE — Progress Notes (Signed)
 Pt states that her foley keeps irritating her and she wants it removed. Pt only has foley in peri-operatively and has orders to remove in the am. Daughter is at bedside and she is concerned about foley bothering pt as well. Pt and daughter state that it has been doing it since this evening and has not gotten better. RN spoke with charge RN and was told that it was okay to go ahead and remove it due to it needing to be removed in the am anyway and pt had already been up and OOB with PT earlier, so getting up to void will not be an issue. RN removed foley and pt immediately felt better. RN will continue to monitor pt.   Bari HERO Polina Burmaster

## 2023-09-30 NOTE — Progress Notes (Signed)
 Subjective: 1 Day Post-Op Procedure(s) (LRB): ARTHROPLASTY, KNEE, TOTAL (Left) Patient reports pain as moderate.    Objective: Vital signs in last 24 hours: Temp:  [97.3 F (36.3 C)-98.3 F (36.8 C)] 98.3 F (36.8 C) (09/03 0430) Pulse Rate:  [65-106] 106 (09/03 0430) Resp:  [11-20] 17 (09/03 0430) BP: (104-156)/(66-99) 125/80 (09/03 0430) SpO2:  [95 %-100 %] 97 % (09/03 0430) Weight:  [63.5 kg] 63.5 kg (09/02 0746)  Intake/Output from previous day: 09/02 0701 - 09/03 0700 In: 1242.9 [P.O.:120; I.V.:1022.9; IV Piggyback:100] Out: 950 [Urine:900; Blood:50] Intake/Output this shift: No intake/output data recorded.  Recent Labs    09/30/23 0442  HGB 9.8*   Recent Labs    09/30/23 0442  WBC 8.7  RBC 3.35*  HCT 29.9*  PLT 178   Recent Labs    09/30/23 0442  NA 138  K 4.1  CL 102  CO2 26  BUN 9  CREATININE 0.80  GLUCOSE 142*  CALCIUM  8.5*   No results for input(s): LABPT, INR in the last 72 hours.  Sensation intact distally Intact pulses distally Dorsiflexion/Plantar flexion intact Incision: scant drainage Compartment soft   Assessment/Plan: 1 Day Post-Op Procedure(s) (LRB): ARTHROPLASTY, KNEE, TOTAL (Left) Up with therapy      Penny Henry 09/30/2023, 7:33 AM

## 2023-09-30 NOTE — Progress Notes (Signed)
 Received verbal orders to obtain urine specimen for U/A and C+S.   Bari HERO Trinity Hyland

## 2023-09-30 NOTE — Care Management Obs Status (Signed)
 MEDICARE OBSERVATION STATUS NOTIFICATION   Patient Details  Name: Penny Henry MRN: [id number] Date of Birth: 10-21-1940   Medicare Observation Status Notification Given:  Yes    Vonzell Arrie Sharps 09/30/2023, 1:57 PM

## 2023-09-30 NOTE — TOC Initial Note (Incomplete)
 Transition of Care [facility]) - Initial/Assessment Note    Patient Details  Name: Penny Henry MRN: [id number] Date of Birth: 06-09-1940  [facility] of [facility]) CM/SW Contact:    Rosalva Jon Bloch, RN Phone Number: 09/30/2023, 2:29 PM  Clinical Narrative:                     - s/p L TKA 9/2 From home alone. Supportive daughter and son. Home health PT services prearranged by providers office. Pt without DME needs. Has RW and BSC @ home. Pt with RX med concerns or transportation issues. IP CM following for needs...  Expected Discharge Plan: Home w Home Health Services Barriers to Discharge: Continued Medical Work up   Patient Goals and CMS Choice            Expected Discharge Plan and Services   Discharge Planning Services: CM Consult                               HH Arranged: PT [facility] Agency: [facility] Date [facility] Agency Contacted: 09/30/23 Time HH Agency Contacted: 1426    Prior Living Arrangements/Services   Lives with:: Self Patient language and need for interpreter reviewed:: Yes Do you feel safe going back to the place where you live?: Yes      Need for Family Participation in Patient Care: Yes (Comment) Care giver support system in place?: Yes (comment)   Criminal Activity/Legal Involvement Pertinent to Current Situation/Hospitalization: No - Comment as needed  Activities of Daily Living      Permission Sought/Granted                  Emotional Assessment       Orientation: : Oriented to Self, Oriented to Place, Oriented to Situation, Oriented to  Time Alcohol / Substance Use: Not Applicable Psych Involvement: No (comment)  Admission diagnosis:  Primary osteoarthritis of left knee [M17.12] Status post total left knee replacement [Z96.652] Patient Active Problem List   Diagnosis Date Noted   Status post total left knee replacement 09/29/2023   Fall at home, initial encounter 11/28/2021   Leukocytosis 11/28/2021   GAD (generalized  anxiety disorder) 11/28/2021   Closed displaced fracture of neck of left femur (HCC) 11/27/2021   Nontraumatic complete tear of left rotator cuff 12/08/2019   Chronic left shoulder pain 12/08/2019   Chronic pain of left knee 10/14/2017   Unilateral primary osteoarthritis, left knee 09/16/2017   Stasis dermatitis 09/16/2017   Allergic rhinitis 09/08/2017   PVC (premature ventricular contraction) 08/03/2014   SVT (supraventricular tachycardia) (HCC) 08/03/2014   PCP:  Clemmie Nest, MD Pharmacy:   [facility] [place], [place] - [address] [place] [address] Phone: [phone] Fax: [phone]     Social Drivers of Health (SDOH) Social History: SDOH Screenings   Food Insecurity: No Food Insecurity (09/29/2023)  Housing: Low Risk  (09/29/2023)  Transportation Needs: No Transportation Needs (09/29/2023)  Utilities: Not At Risk (09/29/2023)  Social Connections: Unknown (09/30/2023)  Tobacco Use: Low Risk  (09/29/2023)   SDOH Interventions:     Readmission Risk Interventions    12/02/2021   11:23 AM  Readmission Risk Prevention Plan  Post Dischage Appt Complete  Medication Screening Complete  Transportation Screening Complete

## 2023-09-30 NOTE — Progress Notes (Signed)
 Patient agitated, pulling scds off, pulling ice man off, having poor safety and attention, not following commands. pt reassessed and [facility] called, Ronal Grave PA called changed PRN medications. Pt bed is in low position, wheels locked, bed alarm is on, call bell within reach.

## 2023-09-30 NOTE — Progress Notes (Signed)
 Mobility Specialist Progress Note:    09/30/23 1203  Mobility  Activity Pivoted/transferred from bed to chair  Level of Assistance Contact guard assist, steadying assist  Assistive Device Front wheel walker  Distance Ambulated (ft) 5 ft  LLE Weight Bearing Per Provider Order WBAT  Activity Response Tolerated well  Mobility Referral Yes  Mobility visit 1 Mobility  Mobility Specialist Start Time (ACUTE ONLY) 1114  Mobility Specialist Stop Time (ACUTE ONLY) 1126  Mobility Specialist Time Calculation (min) (ACUTE ONLY) 12 min   Pt received in bed requesting assistance getting to chair. Pt required no physical assistance throughout session, contact guard for safety. Was able to take a couple steps towards the chair w/o fault. Left in chair w/ call bell and personal belongings in reach. All needs met. Chair alarm on.  Thersia Minder Mobility Specialist  Please contact vis Secure Chat or  Rehab Office [phone]

## 2023-09-30 NOTE — Progress Notes (Signed)
 Physical Therapy Treatment Patient Details Name: Penny Henry MRN: [id number] DOB: January 19, 1941 Today's Date: 09/30/2023   History of Present Illness 83 y.o. female presents to [facility] hospital on 09/29/2023 for elective L TKA. PMH includes SVT, GAD, chronic L shoulder pain.    PT Comments  Pt received in chair, c/o mild to moderate LLE pain, pleasantly agreeable to therapy session. Pt with good participation in LLE exercises, transfer and gait training as well as stair negotiation. Gait distance/standing tolerance slightly limited today due to symptomatic orthostatic hypotension with c/o subject LE weakness with standing >5 mins, so chair follow provided for safety for gait in hallway and RN/MD notified of drop in SBP with transfer to standing and mildly elevated HR with exertion. Pt continues to benefit from PT services to progress toward functional mobility goals.   Patient Position (if appropriate) Orthostatic Vitals  Orthostatic Sitting  BP- Sitting 130/74  Pulse- Sitting 101  Orthostatic Standing at 0 minutes  BP- Standing at 0 minutes 101/64  Pulse- Standing at 0 minutes 112     If plan is discharge home, recommend the following: A little help with walking and/or transfers;A little help with bathing/dressing/bathroom;Assistance with cooking/housework;Assist for transportation;Help with stairs or ramp for entrance   Can travel by private vehicle        Equipment Recommendations  None recommended by PT    Recommendations for Other Services       Precautions / Restrictions Precautions Precautions: Fall;Knee Precaution Booklet Issued: Yes (comment) Recall of Precautions/Restrictions: Intact Precaution/Restrictions Comments: decreased carryover of safety cues with UE placement Restrictions Weight Bearing Restrictions Per Provider Order: Yes LLE Weight Bearing Per Provider Order: Weight bearing as tolerated     Mobility  Bed Mobility Overal bed mobility: Needs Assistance              General bed mobility comments: pt received up in chair    Transfers Overall transfer level: Needs assistance Equipment used: Rolling walker (2 wheels) Transfers: Sit to/from Stand Sit to Stand: Contact guard assist           General transfer comment: from chair>RW and RW<>mat table in PT gym, then RW>chair in room. Pt forgets safe UE placement before sit>stand and also before stand>sit, needs cues each time.    Ambulation/Gait Ambulation/Gait assistance: Contact guard assist, Min assist, +2 safety/equipment Gait Distance (Feet): 90 Feet (x2 with seated break ~5 mins to recover between bouts) Assistive device: Rolling walker (2 wheels) Gait Pattern/deviations: Step-through pattern, Decreased stride length, Narrow base of support, Decreased dorsiflexion - left, Decreased weight shift to left Gait velocity: reduced Gait velocity interpretation: <1.8 ft/sec, indicate of risk for recurrent falls   General Gait Details: slowed step-through gait, verbal cues to maintain RW closer to BOS to avoid trunk flexion and also instructed on step-to pattern sequencing with RW for less pain as LLE pain increases, pt with poor carryover of this instruction needing max cues to understand. Pt c/o weakness sensation in LE as distance increased so given chair follow for second gait trial with orthostatic BP checked after seated rest break and noted SBP drop 29 points from sit>stand, RN/MD notified. HR to ~117 bpm with exertion as well, decreased to ~100 bpm at rest   Stairs Stairs: Yes Stairs assistance: Contact guard assist Stair Management: Two rails, Step to pattern, Forwards Number of Stairs: 2 General stair comments: cues for safety/sequencing, fair carryover, no buckling or LOB.   Wheelchair Mobility     Tilt Bed  Modified Rankin (Stroke Patients Only)       Balance Overall balance assessment: Needs assistance Sitting-balance support: No upper extremity supported, Feet  supported Sitting balance-Leahy Scale: Good     Standing balance support: Single extremity supported, Reliant on assistive device for balance, Bilateral upper extremity supported, During functional activity Standing balance-Leahy Scale: Poor Standing balance comment: RW reliant due to LLE pain and subjective c/o weakness with drop in BP                            Communication Communication Communication: No apparent difficulties  Cognition Arousal: Alert Behavior During Therapy: WFL for tasks assessed/performed   PT - Cognitive impairments: No apparent impairments                         Following commands: Intact      Cueing Cueing Techniques: Verbal cues  Exercises Total Joint Exercises Goniometric ROM: L knee flexion AAROM in recliner 7 deg to 70 deg Other Exercises Other Exercises: PTA provides education on TKR exercise packet. Pt performs reclined heel slides, ankle pumps, quad sets x5-10 reps ea with vcs/tcs for teachback. Other Exercises: seated LLE AROM: LAQ x 5 reps good extension/quad contraction    General Comments General comments (skin integrity, edema, etc.): symptomatic orthostatic hypotension, see VS above, RN/MD notified. SpO2 93% on RA ambulating and at rest      Pertinent Vitals/Pain Pain Assessment Pain Assessment: Faces Faces Pain Scale: Hurts even more Pain Location: L knee, worse with weight bearing Pain Descriptors / Indicators: Aching, Discomfort, Grimacing Pain Intervention(s): Limited activity within patient's tolerance, Monitored during session, Premedicated before session, Repositioned, Ice applied    Home Living                          Prior Function            PT Goals (current goals can now be found in the care plan section) Acute Rehab PT Goals Patient Stated Goal: to return to independence PT Goal Formulation: With patient Time For Goal Achievement: 10/03/23 Progress towards PT goals: Progressing  toward goals    Frequency    7X/week      PT Plan      Co-evaluation              AM-PAC PT 6 Clicks Mobility   Outcome Measure  Help needed turning from your back to your side while in a flat bed without using bedrails?: A Little Help needed moving from lying on your back to sitting on the side of a flat bed without using bedrails?: A Little Help needed moving to and from a bed to a chair (including a wheelchair)?: A Little Help needed standing up from a chair using your arms (e.g., wheelchair or bedside chair)?: A Little Help needed to walk in hospital room?: A Lot (mod cues, chair follow due to BP drop) Help needed climbing 3-5 steps with a railing? : A Little 6 Click Score: 17    End of Session Equipment Utilized During Treatment: Gait belt Activity Tolerance: Patient tolerated treatment well;Other (comment);Treatment limited secondary to medical complications (Comment) (symptomatic drop in BP standing but prior to this, moving well, just c/o weakness with longer distance) Patient left: in chair;with call bell/phone within reach;with family/visitor present;with chair alarm set (daugther arrived to room mid-session.) Nurse Communication: Mobility status;Precautions;Other (comment) (drop in BP  in standing; slightly tachy) PT Visit Diagnosis: Other abnormalities of gait and mobility (R26.89);Muscle weakness (generalized) (M62.81);Pain Pain - Right/Left: Left Pain - part of body: Knee     Time: 1142-1226 PT Time Calculation (min) (ACUTE ONLY): 44 min  Charges:    $Gait Training: 8-22 mins $Therapeutic Exercise: 8-22 mins $Therapeutic Activity: 8-22 mins PT General Charges $$ ACUTE PT VISIT: 1 Visit                     Trevis Eden P., PTA Acute Rehabilitation Services Secure Chat Preferred 9a-5:30pm Office: [phone]    Connell HERO [facility] 09/30/2023, 12:45 PM

## 2023-09-30 NOTE — Progress Notes (Signed)
 Pt is very confused and trying to get OOB. Daughter is concerned that pt may have a UTI and wants to have her urine checked. RN called [facility] to see if we can have an order. RN awaiting call back from MD.   Bari HERO [facility]

## 2023-09-30 NOTE — Progress Notes (Signed)
 Physical Therapy Treatment Patient Details Name: Penny Henry MRN: [id number] DOB: 1940-10-03 Today's Date: 09/30/2023   History of Present Illness 83 y.o. female presents to [facility] hospital on 09/29/2023 for elective L TKA. PMH includes SVT, GAD, chronic L shoulder pain.    PT Comments  Pt received in supine, agreeable to therapy session and with improved VS in PM session. Pt BP/HR more stable and no acute s/sx distress with standing activity other than c/o pain as gait distance increased. Pt with knee flexion to ~70 deg and nearly TKE with heel sides/quad sets. CGA for safety with pt needing repetition of cues for safety with transfers, pt had trouble understanding step-to gait pattern but no buckling with step-through pattern. Pt continues to benefit from PT services to progress toward functional mobility goals.     If plan is discharge home, recommend the following: A little help with walking and/or transfers;A little help with bathing/dressing/bathroom;Assistance with cooking/housework;Assist for transportation;Help with stairs or ramp for entrance   Can travel by private vehicle        Equipment Recommendations  None recommended by PT    Recommendations for Other Services       Precautions / Restrictions Precautions Precautions: Fall;Knee Precaution Booklet Issued: Yes (comment) Recall of Precautions/Restrictions: Intact Restrictions Weight Bearing Restrictions Per Provider Order: Yes LLE Weight Bearing Per Provider Order: Weight bearing as tolerated     Mobility  Bed Mobility Overal bed mobility: Needs Assistance Bed Mobility: Supine to Sit, Sit to Supine     Supine to sit: Supervision Sit to supine: Contact guard assist   General bed mobility comments: from flat bed, no rails    Transfers Overall transfer level: Needs assistance Equipment used: Rolling walker (2 wheels) Transfers: Sit to/from Stand Sit to Stand: Contact guard assist           General transfer  comment: EOB<>RW; Pt forgets safe UE placement before sit>stand and also before stand>sit, needs cues each time.    Ambulation/Gait Ambulation/Gait assistance: Contact guard assist Gait Distance (Feet): 75 Feet Assistive device: Rolling walker (2 wheels) Gait Pattern/deviations: Step-through pattern, Decreased stride length, Narrow base of support, Decreased dorsiflexion - left, Decreased weight shift to left Gait velocity: reduced Gait velocity interpretation: <1.8 ft/sec, indicate of risk for recurrent falls   General Gait Details: slowed step-through gait, verbal cues to maintain RW closer to BOS and also instructed on step-to pattern sequencing with RW for less pain as LLE pain increases, pt with poor carryover of this instruction needing max cues to understand. no c/o weakness in PM session adn pt BP stable sit>stand and 3 mins after standing pre-gait exercise at [employer] Stairs: Yes Stairs assistance: Contact guard assist Stair Management: Two rails, Step to pattern, Forwards Number of Stairs: 2 General stair comments: defer due to pain in PM, pt performed well in AM anticipate no concerns with stairs for DC   Wheelchair Mobility     Tilt Bed    Modified Rankin (Stroke Patients Only)       Balance Overall balance assessment: Needs assistance Sitting-balance support: No upper extremity supported, Feet supported Sitting balance-Leahy Scale: Good     Standing balance support: Single extremity supported, Reliant on assistive device for balance, Bilateral upper extremity supported, During functional activity Standing balance-Leahy Scale: Poor Standing balance comment: Fair static standing with CGA for BP reading unsupported; RW reliant due to LLE pain for amb  Communication Communication Communication: No apparent difficulties  Cognition Arousal: Alert Behavior During Therapy: WFL for tasks assessed/performed   PT - Cognitive  impairments: Sequencing, Safety/Judgement                       PT - Cognition Comments: pt forgetting to push from bed and has trouble sequencing step-to pattern more in PM, but VS more stable in afternoon session; per RN, she had urinary incontinence in chair prior to session with slow response to call bell. Following commands: Intact      Cueing Cueing Techniques: Verbal cues, Tactile cues, Gestural cues  Exercises Total Joint Exercises Ankle Circles/Pumps: AROM, Both, 10 reps, Supine Quad Sets: AROM, Both, 5 reps, Supine Short Arc Quad: AROM, Left, 10 reps, Supine Heel Slides: AROM, Left, 5 reps, Supine Hip ABduction/ADduction: AROM, Left, 10 reps, Supine Straight Leg Raises: AROM, Left, 10 reps, Supine Goniometric ROM: similar ROM to AM session, moving well Other Exercises Other Exercises: PTA provides education on TKR exercise packet. Pt performs standing hip flexion x10 reps at RW prior to second standing BP assessment Other Exercises: seated LLE AROM: LAQ x 5 reps good extension/quad contraction    General Comments General comments (skin integrity, edema, etc.): BP stable with postural changes in PM after bolus given over lunch time      Pertinent Vitals/Pain Pain Assessment Pain Assessment: 0-10 Faces Pain Scale: Hurts even more Pain Location: L knee, worse with weight bearing Pain Descriptors / Indicators: Aching, Discomfort, Grimacing Pain Intervention(s): Limited activity within patient's tolerance, Monitored during session, Repositioned, Patient requesting pain meds-RN notified, Ice applied (pt reporting moderate to severe pain at end of session, initially minimal c/o pain with knee flexion prior to OOB, RN notified of pt s/sx pain)    Home Living                          Prior Function            PT Goals (current goals can now be found in the care plan section) Acute Rehab PT Goals Patient Stated Goal: to return to independence PT Goal  Formulation: With patient Time For Goal Achievement: 10/03/23 Progress towards PT goals: Progressing toward goals    Frequency    7X/week      PT Plan      Co-evaluation              AM-PAC PT 6 Clicks Mobility   Outcome Measure  Help needed turning from your back to your side while in a flat bed without using bedrails?: A Little Help needed moving from lying on your back to sitting on the side of a flat bed without using bedrails?: A Little Help needed moving to and from a bed to a chair (including a wheelchair)?: A Little Help needed standing up from a chair using your arms (e.g., wheelchair or bedside chair)?: A Little Help needed to walk in hospital room?: A Lot (mod cues, chair follow due to BP drop) Help needed climbing 3-5 steps with a railing? : A Little 6 Click Score: 17    End of Session Equipment Utilized During Treatment: Gait belt Activity Tolerance: Patient tolerated treatment well (gait distance limited within pain tolerance) Patient left: with call bell/phone within reach;in bed;with bed alarm set;with SCD's reapplied;Other (comment) (iceman refilled/reapplied with pillowcase between skin and ice wrap due to cold temp) Nurse Communication: Mobility status;Patient requests pain meds;Other (comment) (pt  pain score appears higher than what she states) PT Visit Diagnosis: Other abnormalities of gait and mobility (R26.89);Muscle weakness (generalized) (M62.81);Pain Pain - Right/Left: Left Pain - part of body: Knee     Time: 1625-1701 PT Time Calculation (min) (ACUTE ONLY): 36 min  Charges:    $Gait Training: 8-22 mins $Therapeutic Exercise: 8-22 mins PT General Charges $$ ACUTE PT VISIT: 1 Visit                     Srikar Chiang P., PTA Acute Rehabilitation Services Secure Chat Preferred 9a-5:30pm Office: [phone]    Connell CHRISTELLA Blue 09/30/2023, 5:58 PM

## 2023-10-01 DIAGNOSIS — Z96642 Presence of left artificial hip joint: Secondary | ICD-10-CM | POA: Diagnosis not present

## 2023-10-01 DIAGNOSIS — M1712 Unilateral primary osteoarthritis, left knee: Secondary | ICD-10-CM | POA: Diagnosis not present

## 2023-10-01 DIAGNOSIS — Z7982 Long term (current) use of aspirin: Secondary | ICD-10-CM | POA: Diagnosis not present

## 2023-10-01 LAB — URINALYSIS, ROUTINE W REFLEX MICROSCOPIC
Bacteria, UA: NONE SEEN
Bilirubin Urine: NEGATIVE
Glucose, UA: NEGATIVE mg/dL
Ketones, ur: NEGATIVE mg/dL
Leukocytes,Ua: NEGATIVE
Nitrite: NEGATIVE
Protein, ur: NEGATIVE mg/dL
Specific Gravity, Urine: 1.013 (ref 1.005–1.030)
pH: 6 (ref 5.0–8.0)

## 2023-10-01 MED ORDER — ASPIRIN 81 MG PO CHEW: 81.0000 mg | CHEWABLE_TABLET | Freq: Two times a day (BID) | ORAL | 0 refills | Status: AC

## 2023-10-01 MED ORDER — ASPIRIN 81 MG PO TBEC: 81.0000 mg | DELAYED_RELEASE_TABLET | Freq: Every day | ORAL | Status: AC

## 2023-10-01 MED ORDER — HYDROCODONE-ACETAMINOPHEN 5-325 MG PO TABS: 1.0000 | ORAL_TABLET | Freq: Four times a day (QID) | ORAL | 0 refills | Status: DC | PRN

## 2023-10-01 NOTE — Discharge Summary (Signed)
 Patient ID: Penny Henry MRN: [id number] DOB/AGE: 04-25-1940 83 y.o.  Admit date: 09/29/2023 Discharge date: 10/01/2023  Admission Diagnoses:  Principal Problem:   Unilateral primary osteoarthritis, left knee Active Problems:   Status post total left knee replacement   Discharge Diagnoses:  Same  Past Medical History:  Diagnosis Date   Acid reflux disease    Anxiety    Arthritis    Borderline hypercholesterolemia    Depression    Dysrhythmia    SVT/PVCs   Family history of adverse reaction to anesthesia    Son wakes up during surgery   Insomnia    Irritable bowel syndrome     Surgeries: Procedure(s): ARTHROPLASTY, KNEE, TOTAL on 09/29/2023   Consultants:   Discharged Condition: Improved  Hospital Course: STEPHANYE FINNICUM is an 83 y.o. female who was admitted 09/29/2023 for operative treatment ofUnilateral primary osteoarthritis, left knee. Patient has severe unremitting pain that affects sleep, daily activities, and work/hobbies. After pre-op clearance the patient was taken to the operating room on 09/29/2023 and underwent  Procedure(s): ARTHROPLASTY, KNEE, TOTAL.    Patient was given perioperative antibiotics:  Anti-infectives (From admission, onward)    Start     Dose/Rate Route Frequency Ordered Stop   09/29/23 1430  ceFAZolin  (ANCEF ) IVPB 2g/100 mL premix        2 g 200 mL/hr over 30 Minutes Intravenous Every 6 hours 09/29/23 1338 09/30/23 0742   09/29/23 0745  ceFAZolin  (ANCEF ) IVPB 2g/100 mL premix        2 g 200 mL/hr over 30 Minutes Intravenous On call to O.R. 09/29/23 0734 09/29/23 0915        Patient was given sequential compression devices, early ambulation, and chemoprophylaxis to prevent DVT.  Inpatient Morphine Milligram Equivalents Per Day 9/2 - 9/4   Values displayed are in units of MME/Day    Order Start / End Date 9/2 Yesterday Today    oxyCODONE  (Oxy IR/ROXICODONE ) immediate release tablet 5 mg 9/2 - 9/2 0 of Unknown -- --    oxyCODONE   (ROXICODONE ) 5 MG/5ML solution 5 mg 9/2 - 9/2 0 of Unknown -- --      Group total: 0 of Unknown      fentaNYL  (SUBLIMAZE ) injection 25-50 mcg 9/2 - 9/2 0 of 45-90 -- --    fentaNYL  (SUBLIMAZE ) 100 MCG/2ML injection 9/2 - 9/2 0 of Unknown -- --    fentaNYL  (SUBLIMAZE ) injection 50 mcg 9/2 - 9/2 15 of 15 -- --    HYDROmorphone  (DILAUDID ) injection 0.5-1 mg 9/2 - 9/4 0 of 30-60 20 of 70-140 0 of 10-20    oxyCODONE  (Oxy IR/ROXICODONE ) immediate release tablet 5-10 mg 9/2 - 9/3 0 of 22.5-45 0 of 37.5-75 --    oxyCODONE  (Oxy IR/ROXICODONE ) immediate release tablet 10-15 mg 9/2 - 9/3 15 of 45-67.5 15 of 75-112.5 --    Daily Totals  30 of Unknown (at least 157.5-277.5) 35 of 182.5-327.5 0 of 10-20    Calculation Errors     Order Type Date Details   oxyCODONE  (Oxy IR/ROXICODONE ) immediate release tablet 5 mg Ordered Dose -- Insufficient frequency information   oxyCODONE  (ROXICODONE ) 5 MG/5ML solution 5 mg Ordered Dose -- Insufficient frequency information   fentaNYL  (SUBLIMAZE ) 100 MCG/2ML injection Ordered Dose -- Frequency type could not be determined            Patient benefited maximally from hospital stay and there were no complications.    Recent vital signs: Patient Vitals for the past 24 hrs:  BP Temp Temp src Pulse Resp SpO2  10/01/23 0840 126/79 99 F (37.2 C) -- -- 17 96 %  10/01/23 0436 (!) 143/84 98.4 F (36.9 C) Oral (!) 110 15 92 %  09/30/23 1919 (!) 155/85 98.4 F (36.9 C) -- (!) 109 17 94 %  09/30/23 1821 (!) 147/82 -- -- (!) 103 -- 92 %  09/30/23 1700 -- -- -- -- -- 97 %  09/30/23 1356 121/68 -- -- 88 16 96 %  09/30/23 1220 101/64 -- -- (!) 112 -- --     Recent laboratory studies:  Recent Labs    09/30/23 0442  WBC 8.7  HGB 9.8*  HCT 29.9*  PLT 178  NA 138  K 4.1  CL 102  CO2 26  BUN 9  CREATININE 0.80  GLUCOSE 142*  CALCIUM  8.5*     Discharge Medications:   Allergies as of 10/01/2023       Reactions   Doxycycline    Doxycycline Hyclate Hives    Other reaction(s): Other (See Comments) Unknown   Povidone Iodine Hives        Medication List     STOP taking these medications    traMADol  50 MG tablet Commonly known as: ULTRAM        TAKE these medications    aspirin  81 MG chewable tablet Chew 1 tablet (81 mg total) by mouth 2 (two) times daily. What changed: You were already taking a medication with the same name, and this prescription was added. Make sure you understand how and when to take each.   aspirin  EC 81 MG tablet Take 1 tablet (81 mg total) by mouth daily. Swallow whole. Start taking on: October 16, 2023 What changed: These instructions start on October 16, 2023. If you are unsure what to do until then, ask your doctor or other care provider.   buPROPion  150 MG 24 hr tablet Commonly known as: WELLBUTRIN  XL Take 150 mg by mouth daily.   celecoxib  200 MG capsule Commonly known as: CELEBREX  Take 1 capsule (200 mg total) by mouth 2 (two) times daily as needed.   cetirizine 10 MG tablet Commonly known as: ZYRTEC Take 10 mg by mouth daily as needed for allergies.   Citracal Calcium  +D3 600-40-500 MG-MG-UNIT Tb24 Generic drug: Calcium -Magnesium -Vitamin D  Take 1 tablet by mouth daily.   COLLAGEN PO Take 2 Scoops by mouth daily. Collagen peptides   escitalopram  20 MG tablet Commonly known as: LEXAPRO  Take 20 mg by mouth daily.   ESTER C PO Take 500 mg by mouth daily.   famotidine  20 MG tablet Commonly known as: PEPCID  Take 20 mg by mouth daily.   FIBER ADULT GUMMIES PO Take 1 tablet by mouth daily.   Fish Oil 1200 MG Cpdr Take 1 tablet by mouth 2 (two) times daily.   HYDROcodone -acetaminophen  5-325 MG tablet Commonly known as: NORCO/VICODIN Take 1 tablet by mouth every 6 (six) hours as needed for moderate pain (pain score 4-6).   ICAPS AREDS 2 PO Take 1 tablet by mouth daily.   LORazepam  1 MG tablet Commonly known as: ATIVAN  Take 1 mg by mouth 2 (two) times daily.   MAGNESIUM   PO Take 400 mg by mouth daily. Magnesium  Glycinate   montelukast  10 MG tablet Commonly known as: SINGULAIR  Take 10 mg by mouth at bedtime.   MULTI-VITAMINS PO Take 1 tablet by mouth daily. Centrum Silver   OVER THE COUNTER MEDICATION Take 1 tablet by mouth daily. instaflex   OVER THE COUNTER  MEDICATION Take 1 tablet by mouth daily. Osteo bi flex   pantoprazole  40 MG tablet Commonly known as: PROTONIX  Take 40 mg by mouth every morning.   polyethylene glycol 17 g packet Commonly known as: MIRALAX  / GLYCOLAX  Take 17 g by mouth daily as needed for mild constipation.   VITAMIN A PO Take 3,000 mg by mouth daily.   Vitamin D -3 125 MCG (5000 UT) Tabs Take 5,000 Units by mouth daily.   zinc  gluconate 50 MG tablet Take 50 mg by mouth daily.               Durable Medical Equipment  (From admission, onward)           Start     Ordered   09/30/23 1422  DME 3 n 1  Once       Comments: Bedside commode. Confine to one room   09/30/23 1422   09/29/23 1339  DME Walker rolling  Once       Question Answer Comment  Walker: With 5 Inch Wheels   Patient needs a walker to treat with the following condition Status post total left knee replacement      09/29/23 1338            Diagnostic Studies: DG Knee Left Port Result Date: 09/29/2023 CLINICAL DATA:  Status post left knee replacement. EXAM: PORTABLE LEFT KNEE - 1-2 VIEW COMPARISON:  Preoperative imaging FINDINGS: Left knee arthroplasty in expected alignment. No periprosthetic lucency or fracture. There has been patellar resurfacing. Recent postsurgical change includes air and edema in the soft tissues and joint space. Anterior skin staples in place. IMPRESSION: Left knee arthroplasty without immediate postoperative complication. Electronically Signed   By: Andrea Gasman M.D.   On: 09/29/2023 13:43    Disposition: Discharge disposition: 01-Home or Self Care          Follow-up Information     Vernetta Lonni GRADE, MD Follow up in 2 week(s).   Specialty: Orthopedic Surgery Contact information: [address] [place] [address] [phone]         Clemmie Nest, MD Follow up.   Specialty: Family Medicine Contact information: [address] [place] [address] [phone]         [facility] Follow up.   Specialty: Home Health Services Why: Home health services will be provided by [facility] Contact information: [address] [place] [address] [phone]                  Signed: Lonni GRADE Vernetta 10/01/2023, 11:42 AM

## 2023-10-01 NOTE — Progress Notes (Signed)
 Physical Therapy Treatment Patient Details Name: Penny Henry MRN: [id number] DOB: 01-Sep-1940 Today's Date: 10/01/2023   History of Present Illness 83 y.o. female presents to [facility] hospital on 09/29/2023 for elective L TKA. PMH includes SVT, GAD, chronic L shoulder pain.    PT Comments  Today's session focused on gait training and stair training. Reviewed proper and safe technique. Minimal cues for improved sequencing during functional mobility. Pt advance gait distance, ambulating ~249ft using a RW with a step-through gait pattern and CGA. She completed 2 stairs three times ascending with RLE and descending with LLE with BUE support on railings and CGA. Patient and family feel ready and safe for discharge home. I have answered all their questions related to mobility.       If plan is discharge home, recommend the following: A little help with walking and/or transfers;A little help with bathing/dressing/bathroom;Assistance with cooking/housework;Assist for transportation;Help with stairs or ramp for entrance   Can travel by private vehicle        Equipment Recommendations  None recommended by PT    Recommendations for Other Services       Precautions / Restrictions Precautions Precautions: Fall;Knee Precaution Booklet Issued: Yes (comment) Recall of Precautions/Restrictions: Intact Restrictions Weight Bearing Restrictions Per Provider Order: Yes LLE Weight Bearing Per Provider Order: Weight bearing as tolerated     Mobility  Bed Mobility Overal bed mobility: Needs Assistance Bed Mobility: Supine to Sit     Supine to sit: Supervision, HOB elevated     General bed mobility comments: Pt sat up on left side of bed with HOB elevated to 30deg and no use of bedrails or cues for sequencing. She brought BLE off EOB, elevated trunk, and scooted fwd until feet flat.    Transfers Overall transfer level: Needs assistance Equipment used: Rolling walker (2 wheels) Transfers: Sit to/from  Stand Sit to Stand: Contact guard assist           General transfer comment: Pt stood from lowest bed height and commode. She demonstrated proper hand placment using RW. Powered up with CGA. Good eccentric control.    Ambulation/Gait Ambulation/Gait assistance: Contact guard assist Gait Distance (Feet): 200 Feet Assistive device: Rolling walker (2 wheels) Gait Pattern/deviations: Step-to pattern, Step-through pattern, Decreased stride length, Decreased dorsiflexion - left, Decreased weight shift to left       General Gait Details: Pt initially ambulated with a step-to gait pattern advancing to step-through as her LLE warmed up. Pt maintained upright posture and good proximity to RW. Limited heel strike and push off on LLE. Adequate foot clearence. Pt navigated room and hallway well. Offloading LLE through BUE support on RW. No LOB.   Stairs Stairs: Yes Stairs assistance: Contact guard assist Stair Management: Two rails, Forwards, Step to pattern Number of Stairs: 2 (x3) General stair comments: Educated pt to ascend with RLE and descend with LLE. She went up initially with an alternating pattern and down leading with LLE. Reviewed education and provided cues for sequencing. Pt demonstrated correct technique and improved understanding. Discussed how her family should be positioned and that they should bring her the RW in/out of the house as appropriate.   Wheelchair Mobility     Tilt Bed    Modified Rankin (Stroke Patients Only)       Balance Overall balance assessment: Needs assistance Sitting-balance support: No upper extremity supported, Feet supported Sitting balance-Leahy Scale: Good Sitting balance - Comments: Pt sat with supervision. She performed pericare independently.   Standing balance  support: Reliant on assistive device for balance, Bilateral upper extremity supported, During functional activity Standing balance-Leahy Scale: Poor Standing balance comment:  Fair statically, pt able to wash hands at sink without LOB. Dependent on RW during transfers/gait                            Communication Communication Communication: No apparent difficulties  Cognition Arousal: Alert Behavior During Therapy: WFL for tasks assessed/performed   PT - Cognitive impairments: No apparent impairments                         Following commands: Intact      Cueing Cueing Techniques: Verbal cues, Tactile cues, Gestural cues  Exercises      General Comments General comments (skin integrity, edema, etc.): VSS on RA. Encouraged pt to frequently mobilize at home. Reviewed TKA HEP and instructed her to complete 2-3 sets a day at home.      Pertinent Vitals/Pain Pain Assessment Pain Assessment: Faces Faces Pain Scale: Hurts little more Pain Location: L knee Pain Descriptors / Indicators: Discomfort, Aching, Grimacing, Operative site guarding Pain Intervention(s): Monitored during session, Limited activity within patient's tolerance, Repositioned    Home Living                          Prior Function            PT Goals (current goals can now be found in the care plan section) Acute Rehab PT Goals Patient Stated Goal: Return Home Progress towards PT goals: Progressing toward goals    Frequency    7X/week      PT Plan      Co-evaluation              AM-PAC PT 6 Clicks Mobility   Outcome Measure  Help needed turning from your back to your side while in a flat bed without using bedrails?: A Little Help needed moving from lying on your back to sitting on the side of a flat bed without using bedrails?: A Little Help needed moving to and from a bed to a chair (including a wheelchair)?: A Little Help needed standing up from a chair using your arms (e.g., wheelchair or bedside chair)?: A Little Help needed to walk in hospital room?: A Little Help needed climbing 3-5 steps with a railing? : A Little 6  Click Score: 18    End of Session Equipment Utilized During Treatment: Gait belt Activity Tolerance: Patient tolerated treatment well Patient left: in chair;with call bell/phone within reach;with family/visitor present Nurse Communication: Mobility status PT Visit Diagnosis: Other abnormalities of gait and mobility (R26.89);Muscle weakness (generalized) (M62.81);Pain Pain - Right/Left: Left Pain - part of body: Knee     Time: 0740-0800 PT Time Calculation (min) (ACUTE ONLY): 20 min  Charges:    $Gait Training: 8-22 mins PT General Charges $$ ACUTE PT VISIT: 1 Visit                     Randall SAUNDERS, PT, DPT Acute Rehabilitation Services Office: [phone] Secure Chat Preferred  Delon CHRISTELLA Callander 10/01/2023, 8:14 AM

## 2023-10-01 NOTE — Progress Notes (Signed)
 Patient ID: Penny Henry, female   DOB: 1940-11-20, 83 y.o.   MRN: [id number] The patient is awake and alert this morning at the bedside.  There was some acute effusion that she had last night.  I think this is a combination of her age 44 as well as oxycodone  use and Ativan  for her anxiety.  This can also be from being in a different environment.  She is alert this morning.  She denies any significant pain.  They did check a urinalysis which was negative for infection.  She is afebrile.  Her left operative knee is stable.  Hopefully she can be discharged to home by this afternoon if she continues to improve.  Will put in discharge orders for the possibility of that discharge this afternoon.

## 2023-10-01 NOTE — Progress Notes (Signed)
 DC order noted per MD. DC RN at bedside with primary RN. No needs for discharge. Patient tolerated PT well. No TOC/home meds. AVS printed/reviewed with patient/dtr at bedside, agreeable with discharge [facility] plan. PIV removed. Skin intact. See LDAs for more information. No telemonitor present on assessment. All belongings accounted for including iceman. Patient wheeled downstairs, safely transferred into private vehicle.

## 2023-10-04 DIAGNOSIS — F419 Anxiety disorder, unspecified: Secondary | ICD-10-CM | POA: Diagnosis not present

## 2023-10-04 DIAGNOSIS — I471 Supraventricular tachycardia, unspecified: Secondary | ICD-10-CM | POA: Diagnosis not present

## 2023-10-04 DIAGNOSIS — F32A Depression, unspecified: Secondary | ICD-10-CM | POA: Diagnosis not present

## 2023-10-04 DIAGNOSIS — K589 Irritable bowel syndrome without diarrhea: Secondary | ICD-10-CM | POA: Diagnosis not present

## 2023-10-04 DIAGNOSIS — I493 Ventricular premature depolarization: Secondary | ICD-10-CM | POA: Diagnosis not present

## 2023-10-04 DIAGNOSIS — K219 Gastro-esophageal reflux disease without esophagitis: Secondary | ICD-10-CM | POA: Diagnosis not present

## 2023-10-04 DIAGNOSIS — I499 Cardiac arrhythmia, unspecified: Secondary | ICD-10-CM | POA: Diagnosis not present

## 2023-10-04 DIAGNOSIS — Z471 Aftercare following joint replacement surgery: Secondary | ICD-10-CM | POA: Diagnosis not present

## 2023-10-04 DIAGNOSIS — E7800 Pure hypercholesterolemia, unspecified: Secondary | ICD-10-CM | POA: Diagnosis not present

## 2023-10-05 ENCOUNTER — Telehealth: Payer: Self-pay

## 2023-10-05 NOTE — Telephone Encounter (Signed)
 Left voice mail

## 2023-10-05 NOTE — Telephone Encounter (Signed)
 Gracie with [facility] called needing a verbal order for the following: Home Health and Physical Therapy 4 times for 1 week                2 times for 1 week  Please call [phone]

## 2023-10-06 DIAGNOSIS — K219 Gastro-esophageal reflux disease without esophagitis: Secondary | ICD-10-CM | POA: Diagnosis not present

## 2023-10-06 DIAGNOSIS — F32A Depression, unspecified: Secondary | ICD-10-CM | POA: Diagnosis not present

## 2023-10-06 DIAGNOSIS — I493 Ventricular premature depolarization: Secondary | ICD-10-CM | POA: Diagnosis not present

## 2023-10-06 DIAGNOSIS — F419 Anxiety disorder, unspecified: Secondary | ICD-10-CM | POA: Diagnosis not present

## 2023-10-06 DIAGNOSIS — E7800 Pure hypercholesterolemia, unspecified: Secondary | ICD-10-CM | POA: Diagnosis not present

## 2023-10-06 DIAGNOSIS — K589 Irritable bowel syndrome without diarrhea: Secondary | ICD-10-CM | POA: Diagnosis not present

## 2023-10-06 DIAGNOSIS — I499 Cardiac arrhythmia, unspecified: Secondary | ICD-10-CM | POA: Diagnosis not present

## 2023-10-06 DIAGNOSIS — Z471 Aftercare following joint replacement surgery: Secondary | ICD-10-CM | POA: Diagnosis not present

## 2023-10-06 DIAGNOSIS — I471 Supraventricular tachycardia, unspecified: Secondary | ICD-10-CM | POA: Diagnosis not present

## 2023-10-07 DIAGNOSIS — K219 Gastro-esophageal reflux disease without esophagitis: Secondary | ICD-10-CM | POA: Diagnosis not present

## 2023-10-07 DIAGNOSIS — F419 Anxiety disorder, unspecified: Secondary | ICD-10-CM | POA: Diagnosis not present

## 2023-10-07 DIAGNOSIS — I499 Cardiac arrhythmia, unspecified: Secondary | ICD-10-CM | POA: Diagnosis not present

## 2023-10-07 DIAGNOSIS — I493 Ventricular premature depolarization: Secondary | ICD-10-CM | POA: Diagnosis not present

## 2023-10-07 DIAGNOSIS — Z471 Aftercare following joint replacement surgery: Secondary | ICD-10-CM | POA: Diagnosis not present

## 2023-10-07 DIAGNOSIS — I471 Supraventricular tachycardia, unspecified: Secondary | ICD-10-CM | POA: Diagnosis not present

## 2023-10-07 DIAGNOSIS — K589 Irritable bowel syndrome without diarrhea: Secondary | ICD-10-CM | POA: Diagnosis not present

## 2023-10-07 DIAGNOSIS — F32A Depression, unspecified: Secondary | ICD-10-CM | POA: Diagnosis not present

## 2023-10-07 DIAGNOSIS — E7800 Pure hypercholesterolemia, unspecified: Secondary | ICD-10-CM | POA: Diagnosis not present

## 2023-10-09 DIAGNOSIS — Z471 Aftercare following joint replacement surgery: Secondary | ICD-10-CM | POA: Diagnosis not present

## 2023-10-09 DIAGNOSIS — E7800 Pure hypercholesterolemia, unspecified: Secondary | ICD-10-CM | POA: Diagnosis not present

## 2023-10-09 DIAGNOSIS — I499 Cardiac arrhythmia, unspecified: Secondary | ICD-10-CM | POA: Diagnosis not present

## 2023-10-09 DIAGNOSIS — K589 Irritable bowel syndrome without diarrhea: Secondary | ICD-10-CM | POA: Diagnosis not present

## 2023-10-09 DIAGNOSIS — K219 Gastro-esophageal reflux disease without esophagitis: Secondary | ICD-10-CM | POA: Diagnosis not present

## 2023-10-09 DIAGNOSIS — F32A Depression, unspecified: Secondary | ICD-10-CM | POA: Diagnosis not present

## 2023-10-09 DIAGNOSIS — F419 Anxiety disorder, unspecified: Secondary | ICD-10-CM | POA: Diagnosis not present

## 2023-10-09 DIAGNOSIS — I493 Ventricular premature depolarization: Secondary | ICD-10-CM | POA: Diagnosis not present

## 2023-10-09 DIAGNOSIS — I471 Supraventricular tachycardia, unspecified: Secondary | ICD-10-CM | POA: Diagnosis not present

## 2023-10-12 ENCOUNTER — Encounter: Admitting: Orthopaedic Surgery

## 2023-10-12 ENCOUNTER — Encounter: Payer: Self-pay | Admitting: Orthopaedic Surgery

## 2023-10-12 ENCOUNTER — Ambulatory Visit (INDEPENDENT_AMBULATORY_CARE_PROVIDER_SITE_OTHER): Admitting: Orthopaedic Surgery

## 2023-10-12 DIAGNOSIS — I493 Ventricular premature depolarization: Secondary | ICD-10-CM | POA: Diagnosis not present

## 2023-10-12 DIAGNOSIS — I471 Supraventricular tachycardia, unspecified: Secondary | ICD-10-CM | POA: Diagnosis not present

## 2023-10-12 DIAGNOSIS — F419 Anxiety disorder, unspecified: Secondary | ICD-10-CM | POA: Diagnosis not present

## 2023-10-12 DIAGNOSIS — Z96652 Presence of left artificial knee joint: Secondary | ICD-10-CM

## 2023-10-12 DIAGNOSIS — K219 Gastro-esophageal reflux disease without esophagitis: Secondary | ICD-10-CM | POA: Diagnosis not present

## 2023-10-12 DIAGNOSIS — K589 Irritable bowel syndrome without diarrhea: Secondary | ICD-10-CM | POA: Diagnosis not present

## 2023-10-12 DIAGNOSIS — Z471 Aftercare following joint replacement surgery: Secondary | ICD-10-CM | POA: Diagnosis not present

## 2023-10-12 DIAGNOSIS — F32A Depression, unspecified: Secondary | ICD-10-CM | POA: Diagnosis not present

## 2023-10-12 DIAGNOSIS — I499 Cardiac arrhythmia, unspecified: Secondary | ICD-10-CM | POA: Diagnosis not present

## 2023-10-12 DIAGNOSIS — E7800 Pure hypercholesterolemia, unspecified: Secondary | ICD-10-CM | POA: Diagnosis not present

## 2023-10-12 MED ORDER — HYDROCODONE-ACETAMINOPHEN 5-325 MG PO TABS: 1.0000 | ORAL_TABLET | Freq: Four times a day (QID) | ORAL | 0 refills | Status: DC | PRN

## 2023-10-12 NOTE — Progress Notes (Signed)
 The patient is here today for her first postoperative visit status post a left total knee replacement.  She reports good range of motion and strength.  She has been having home therapy and starts outpatient physical therapy this Friday in [place].  She is a very active 83 year old female.  She is a long-term patient of ours.  She has been on 81 mg aspirin  twice a day and taking hydrocodone .  She was on 81 mg once a day prior to surgery.  She does need a refill of hydrocodone .  Examination of her left knee today shows the incision is intact.  Staples are removed and Steri-Strips applied.  Her calf is soft.  She has almost full extension to 90 degrees flexion.  She looks great overall.  She will start outpatient physical therapy later this week.  I will send in some more hydrocodone  for her.  She will go back to just 1 aspirin  a day that she is on prior to surgery.  We will see her back in a month to see how she is doing overall from a range of motion and mobility standpoint but no x-rays are needed.

## 2023-10-13 DIAGNOSIS — F419 Anxiety disorder, unspecified: Secondary | ICD-10-CM | POA: Diagnosis not present

## 2023-10-13 DIAGNOSIS — F32A Depression, unspecified: Secondary | ICD-10-CM | POA: Diagnosis not present

## 2023-10-13 DIAGNOSIS — I471 Supraventricular tachycardia, unspecified: Secondary | ICD-10-CM | POA: Diagnosis not present

## 2023-10-13 DIAGNOSIS — I499 Cardiac arrhythmia, unspecified: Secondary | ICD-10-CM | POA: Diagnosis not present

## 2023-10-13 DIAGNOSIS — E7800 Pure hypercholesterolemia, unspecified: Secondary | ICD-10-CM | POA: Diagnosis not present

## 2023-10-13 DIAGNOSIS — K219 Gastro-esophageal reflux disease without esophagitis: Secondary | ICD-10-CM | POA: Diagnosis not present

## 2023-10-13 DIAGNOSIS — I493 Ventricular premature depolarization: Secondary | ICD-10-CM | POA: Diagnosis not present

## 2023-10-13 DIAGNOSIS — K589 Irritable bowel syndrome without diarrhea: Secondary | ICD-10-CM | POA: Diagnosis not present

## 2023-10-13 DIAGNOSIS — Z471 Aftercare following joint replacement surgery: Secondary | ICD-10-CM | POA: Diagnosis not present

## 2023-10-16 ENCOUNTER — Other Ambulatory Visit: Payer: Self-pay | Admitting: Vascular Surgery

## 2023-10-16 DIAGNOSIS — Z96652 Presence of left artificial knee joint: Secondary | ICD-10-CM | POA: Diagnosis not present

## 2023-10-16 DIAGNOSIS — M25562 Pain in left knee: Secondary | ICD-10-CM | POA: Diagnosis not present

## 2023-10-16 DIAGNOSIS — I872 Venous insufficiency (chronic) (peripheral): Secondary | ICD-10-CM

## 2023-10-16 DIAGNOSIS — R2689 Other abnormalities of gait and mobility: Secondary | ICD-10-CM | POA: Diagnosis not present

## 2023-10-19 DIAGNOSIS — M25562 Pain in left knee: Secondary | ICD-10-CM | POA: Diagnosis not present

## 2023-10-19 DIAGNOSIS — Z96652 Presence of left artificial knee joint: Secondary | ICD-10-CM | POA: Diagnosis not present

## 2023-10-19 DIAGNOSIS — R2689 Other abnormalities of gait and mobility: Secondary | ICD-10-CM | POA: Diagnosis not present

## 2023-10-21 DIAGNOSIS — H353221 Exudative age-related macular degeneration, left eye, with active choroidal neovascularization: Secondary | ICD-10-CM | POA: Diagnosis not present

## 2023-10-22 DIAGNOSIS — R2689 Other abnormalities of gait and mobility: Secondary | ICD-10-CM | POA: Diagnosis not present

## 2023-10-22 DIAGNOSIS — Z96652 Presence of left artificial knee joint: Secondary | ICD-10-CM | POA: Diagnosis not present

## 2023-10-22 DIAGNOSIS — M25562 Pain in left knee: Secondary | ICD-10-CM | POA: Diagnosis not present

## 2023-10-26 DIAGNOSIS — Z96652 Presence of left artificial knee joint: Secondary | ICD-10-CM | POA: Diagnosis not present

## 2023-10-26 DIAGNOSIS — R2689 Other abnormalities of gait and mobility: Secondary | ICD-10-CM | POA: Diagnosis not present

## 2023-10-26 DIAGNOSIS — M25562 Pain in left knee: Secondary | ICD-10-CM | POA: Diagnosis not present

## 2023-10-27 ENCOUNTER — Telehealth: Payer: Self-pay | Admitting: Orthopaedic Surgery

## 2023-10-27 NOTE — Telephone Encounter (Signed)
 Pt called requesting a refill of hydrocodone . Please send to [facility]. Pt phone number is [phone].

## 2023-10-28 ENCOUNTER — Other Ambulatory Visit: Payer: Self-pay | Admitting: Orthopaedic Surgery

## 2023-10-28 MED ORDER — HYDROCODONE-ACETAMINOPHEN 5-325 MG PO TABS: 1.0000 | ORAL_TABLET | Freq: Four times a day (QID) | ORAL | 0 refills | Status: DC | PRN

## 2023-10-30 DIAGNOSIS — R2689 Other abnormalities of gait and mobility: Secondary | ICD-10-CM | POA: Diagnosis not present

## 2023-10-30 DIAGNOSIS — M25562 Pain in left knee: Secondary | ICD-10-CM | POA: Diagnosis not present

## 2023-10-30 DIAGNOSIS — Z96652 Presence of left artificial knee joint: Secondary | ICD-10-CM | POA: Diagnosis not present

## 2023-11-04 DIAGNOSIS — Z96652 Presence of left artificial knee joint: Secondary | ICD-10-CM | POA: Diagnosis not present

## 2023-11-04 DIAGNOSIS — R2689 Other abnormalities of gait and mobility: Secondary | ICD-10-CM | POA: Diagnosis not present

## 2023-11-04 DIAGNOSIS — M25562 Pain in left knee: Secondary | ICD-10-CM | POA: Diagnosis not present

## 2023-11-06 DIAGNOSIS — Z96652 Presence of left artificial knee joint: Secondary | ICD-10-CM | POA: Diagnosis not present

## 2023-11-06 DIAGNOSIS — M25562 Pain in left knee: Secondary | ICD-10-CM | POA: Diagnosis not present

## 2023-11-06 DIAGNOSIS — R2689 Other abnormalities of gait and mobility: Secondary | ICD-10-CM | POA: Diagnosis not present

## 2023-11-09 DIAGNOSIS — R2689 Other abnormalities of gait and mobility: Secondary | ICD-10-CM | POA: Diagnosis not present

## 2023-11-09 DIAGNOSIS — Z96652 Presence of left artificial knee joint: Secondary | ICD-10-CM | POA: Diagnosis not present

## 2023-11-09 DIAGNOSIS — M25562 Pain in left knee: Secondary | ICD-10-CM | POA: Diagnosis not present

## 2023-11-12 DIAGNOSIS — Z96652 Presence of left artificial knee joint: Secondary | ICD-10-CM | POA: Diagnosis not present

## 2023-11-12 DIAGNOSIS — R2689 Other abnormalities of gait and mobility: Secondary | ICD-10-CM | POA: Diagnosis not present

## 2023-11-12 DIAGNOSIS — M25562 Pain in left knee: Secondary | ICD-10-CM | POA: Diagnosis not present

## 2023-11-16 ENCOUNTER — Ambulatory Visit: Admitting: Orthopaedic Surgery

## 2023-11-16 ENCOUNTER — Encounter: Payer: Self-pay | Admitting: Orthopaedic Surgery

## 2023-11-16 DIAGNOSIS — Z96652 Presence of left artificial knee joint: Secondary | ICD-10-CM

## 2023-11-16 NOTE — Progress Notes (Signed)
 The patient is an active 83 year old female who is here today 6 weeks status post a left total knee arthroplasty to treat significant left knee pain and arthritis.  She only has 3 therapy sessions left so she is doing so well.  She is already off of pain medications and not walking medical assistant device and she has driven.  She does report a little bit of right sided low back pain.  Examination right knee does show some swelling to be expected.  Her extension is full and her flexion is almost full.  The knee feels stable.  From my standpoint she can transition to home exercise program after the last 3 physical therapy sessions.  If there is anything they can work on for her back or provide a home exercise program for the right side of her lower back down the right.  Will see her back in 6 months unless there are issues before then.  At the next visit we will have a standing AP and lateral of her left operative knee.

## 2023-11-18 DIAGNOSIS — Z96652 Presence of left artificial knee joint: Secondary | ICD-10-CM | POA: Diagnosis not present

## 2023-11-18 DIAGNOSIS — M25562 Pain in left knee: Secondary | ICD-10-CM | POA: Diagnosis not present

## 2023-11-18 DIAGNOSIS — R2689 Other abnormalities of gait and mobility: Secondary | ICD-10-CM | POA: Diagnosis not present

## 2023-11-23 DIAGNOSIS — R2689 Other abnormalities of gait and mobility: Secondary | ICD-10-CM | POA: Diagnosis not present

## 2023-11-23 DIAGNOSIS — Z96652 Presence of left artificial knee joint: Secondary | ICD-10-CM | POA: Diagnosis not present

## 2023-11-23 DIAGNOSIS — M25562 Pain in left knee: Secondary | ICD-10-CM | POA: Diagnosis not present

## 2023-11-24 DIAGNOSIS — H5212 Myopia, left eye: Secondary | ICD-10-CM | POA: Diagnosis not present

## 2023-11-24 DIAGNOSIS — Z961 Presence of intraocular lens: Secondary | ICD-10-CM | POA: Diagnosis not present

## 2023-11-24 DIAGNOSIS — H353112 Nonexudative age-related macular degeneration, right eye, intermediate dry stage: Secondary | ICD-10-CM | POA: Diagnosis not present

## 2023-11-24 DIAGNOSIS — H524 Presbyopia: Secondary | ICD-10-CM | POA: Diagnosis not present

## 2023-11-24 DIAGNOSIS — H353221 Exudative age-related macular degeneration, left eye, with active choroidal neovascularization: Secondary | ICD-10-CM | POA: Diagnosis not present

## 2023-11-26 DIAGNOSIS — M25562 Pain in left knee: Secondary | ICD-10-CM | POA: Diagnosis not present

## 2023-11-26 DIAGNOSIS — R2689 Other abnormalities of gait and mobility: Secondary | ICD-10-CM | POA: Diagnosis not present

## 2023-11-26 DIAGNOSIS — Z96652 Presence of left artificial knee joint: Secondary | ICD-10-CM | POA: Diagnosis not present

## 2023-11-30 ENCOUNTER — Encounter: Payer: Self-pay | Admitting: Radiology

## 2023-12-01 ENCOUNTER — Telehealth: Payer: Self-pay | Admitting: Orthopaedic Surgery

## 2023-12-01 ENCOUNTER — Other Ambulatory Visit: Payer: Self-pay | Admitting: Orthopaedic Surgery

## 2023-12-01 MED ORDER — TRAMADOL HCL 50 MG PO TABS: 50.0000 mg | ORAL_TABLET | Freq: Four times a day (QID) | ORAL | 0 refills | Status: DC | PRN

## 2023-12-01 NOTE — Telephone Encounter (Signed)
 Pt called stating Rosina B told her if she ever needed her to give her a call. Pt is asking for a call back from [place] B. Pt phone number is [phone].

## 2023-12-04 ENCOUNTER — Encounter: Payer: Self-pay | Admitting: Vascular Surgery

## 2023-12-04 ENCOUNTER — Ambulatory Visit: Admitting: Vascular Surgery

## 2023-12-04 ENCOUNTER — Ambulatory Visit (HOSPITAL_COMMUNITY)
Admission: RE | Admit: 2023-12-04 | Discharge: 2023-12-04 | Disposition: A | Discharge status: Home / Self Care | Source: Ambulatory Visit | Attending: Vascular Surgery | Admitting: Vascular Surgery

## 2023-12-04 VITALS — BP 132/84 | HR 90 | Temp 98.5°F | Resp 18 | Ht 67.0 in | Wt 139.2 lb

## 2023-12-04 DIAGNOSIS — I872 Venous insufficiency (chronic) (peripheral): Secondary | ICD-10-CM | POA: Insufficient documentation

## 2023-12-04 NOTE — Progress Notes (Signed)
 Patient ID: Penny Henry, female   DOB: [birth year], 83 y.o.   MRN: [id number]  Reason for Consult: No chief complaint on file.   Referred by Clemmie Nest, MD  Subjective:     HPI  Penny Henry is a 83 y.o. female who presents for evaluation of lower extremity swelling and discoloration  Timeframe: Many years Symptoms: Some mild aching and heaviness Varicosities: Yes Previous wounds: No Previous DVT: No In compression: No  She has had some procedures done at the vein center but has been many years ago and she does not remember what was done.  Past Medical History:  Diagnosis Date   Acid reflux disease    Anxiety    Arthritis    Borderline hypercholesterolemia    Depression    Dysrhythmia    SVT/PVCs   Family history of adverse reaction to anesthesia    Son wakes up during surgery   Insomnia    Irritable bowel syndrome    Family History  Problem Relation Age of Onset   Arthritis Mother    Arthritis Father    Past Surgical History:  Procedure Laterality Date   ANTERIOR FUSION CERVICAL SPINE  08/1999   Dr. Ozell Lyme at [facility], C3, C4, C5   APPENDECTOMY  06/06/1975   [facility]   CATARACT EXTRACTION W/ INTRAOCULAR LENS IMPLANT Left 03/21/2019   [facility] - Dr. Wanda   CATARACT EXTRACTION W/ INTRAOCULAR LENS IMPLANT Right 04/16/2020   [facility] - Dr. Wanda   KNEE ARTHROSCOPY Left 10/02/2011   Dr. Lonni Poli   LIPOMA EXCISION Left 04/10/2008   Left Upper Arm   TOTAL HIP ARTHROPLASTY Left 11/28/2021   Procedure: TOTAL HIP ARTHROPLASTY ANTERIOR APPROACH;  Surgeon: Poli Lonni GRADE, MD;  Location: [facility] OR;  Service: Orthopedics;  Laterality: Left;   TOTAL KNEE ARTHROPLASTY Left 09/29/2023   Procedure: ARTHROPLASTY, KNEE, TOTAL;  Surgeon: Poli Lonni GRADE, MD;  Location: [facility] OR;  Service: Orthopedics;  Laterality: Left;    Short Social History:  Social History    Tobacco Use   Smoking status: Never   Smokeless tobacco: Never  Substance Use Topics   Alcohol use: Not Currently    Allergies  Allergen Reactions   Doxycycline    Doxycycline Hyclate Hives    Other reaction(s): Other (See Comments) Unknown   Povidone Iodine Hives    Current Outpatient Medications  Medication Sig Dispense Refill   traMADol  (ULTRAM ) 50 MG tablet Take 1-2 tablets (50-100 mg total) by mouth every 6 (six) hours as needed. 30 tablet 0   aspirin  81 MG chewable tablet Chew 1 tablet (81 mg total) by mouth 2 (two) times daily. 30 tablet 0   aspirin  EC 81 MG tablet Take 1 tablet (81 mg total) by mouth daily. Swallow whole.     Bioflavonoid Products (ESTER C PO) Take 500 mg by mouth daily.     buPROPion  (WELLBUTRIN  XL) 150 MG 24 hr tablet Take 150 mg by mouth daily.     Calcium -Magnesium -Vitamin D  (CITRACAL CALCIUM  +D3) 600-40-500 MG-MG-UNIT TB24 Take 1 tablet by mouth daily.     celecoxib  (CELEBREX ) 200 MG capsule Take 1 capsule (200 mg total) by mouth 2 (two) times daily as needed. 60 capsule 2   cetirizine (ZYRTEC) 10 MG tablet Take 10 mg by mouth daily as needed for allergies.     Cholecalciferol  (VITAMIN D -3) 5000 units TABS Take 5,000 Units by mouth daily.     Collagen-Vitamin  C-Biotin (COLLAGEN PO) Take 2 Scoops by mouth daily. Collagen peptides     escitalopram  (LEXAPRO ) 20 MG tablet Take 20 mg by mouth daily.     famotidine  (PEPCID ) 20 MG tablet Take 20 mg by mouth daily.     FIBER ADULT GUMMIES PO Take 1 tablet by mouth daily.     LORazepam  (ATIVAN ) 1 MG tablet Take 1 mg by mouth 2 (two) times daily.  5   MAGNESIUM  PO Take 400 mg by mouth daily. Magnesium  Glycinate     montelukast  (SINGULAIR ) 10 MG tablet Take 10 mg by mouth at bedtime.     Multiple Vitamin (MULTI-VITAMINS PO) Take 1 tablet by mouth daily. Centrum Silver     Multiple Vitamins-Minerals (ICAPS AREDS 2 PO) Take 1 tablet by mouth daily.     Omega-3 Fatty Acids (FISH OIL) 1200 MG CPDR Take 1 tablet  by mouth 2 (two) times daily.      OVER THE COUNTER MEDICATION Take 1 tablet by mouth daily. instaflex     OVER THE COUNTER MEDICATION Take 1 tablet by mouth daily. Osteo bi flex     pantoprazole  (PROTONIX ) 40 MG tablet Take 40 mg by mouth every morning.     polyethylene glycol (MIRALAX  / GLYCOLAX ) 17 g packet Take 17 g by mouth daily as needed for mild constipation. 14 each 0   VITAMIN A PO Take 3,000 mg by mouth daily.     zinc  gluconate 50 MG tablet Take 50 mg by mouth daily.     No current facility-administered medications for this visit.    REVIEW OF SYSTEMS All other systems were reviewed and are negative    Objective:  Objective   There were no vitals filed for this visit. There is no height or weight on file to calculate BMI.  Physical Exam General: no acute distress Cardiac: hemodynamically stable Extremities: Lipodermatosclerosis bilaterally, small varicosities throughout anterior shins bilaterally, venous congestion at feet, telangiectasias throughout Vascular:   Right: palpable DP, PT  Left: palpable DP, PT   Data: Reflux study +--------------+---------+------+-----------+------------+--------+  LEFT         Reflux NoRefluxReflux TimeDiameter cmsComments                          Yes                                   +--------------+---------+------+-----------+------------+--------+  CFV          no                                              +--------------+---------+------+-----------+------------+--------+  FV mid        no                                              +--------------+---------+------+-----------+------------+--------+  Popliteal    no                                              +--------------+---------+------+-----------+------------+--------+  GSV at [facility]  yes    >500 ms      .53               +--------------+---------+------+-----------+------------+--------+  GSV prox thighno                             .41               +--------------+---------+------+-----------+------------+--------+  GSV mid thigh no                            .28               +--------------+---------+------+-----------+------------+--------+  GSV dist thighno                            .25               +--------------+---------+------+-----------+------------+--------+  GSV at knee   no                            .30               +--------------+---------+------+-----------+------------+--------+  GSV prox calf no                            .26               +--------------+---------+------+-----------+------------+--------+  SSV at [facility]    no                            .15               +--------------+---------+------+-----------+------------+--------+  SSV prox calf                                       NWV       +--------------+---------+------+-----------+------------+--------+        Assessment/Plan:     Penny Henry is a 83 y.o. female with chronic venous insufficiency with C4 disease and reflux noted at [facility] I explained the foundation of CVI treatment of compression and elevation I recommended medical grade graduated compression stockings and intermittent leg elevation We also discussed that many patients find symptom improvement with exercise   Follow up PRN      Norman GORMAN Serve MD [facility]

## 2023-12-16 DIAGNOSIS — H353221 Exudative age-related macular degeneration, left eye, with active choroidal neovascularization: Secondary | ICD-10-CM | POA: Diagnosis not present

## 2023-12-28 ENCOUNTER — Telehealth: Payer: Self-pay | Admitting: Orthopaedic Surgery

## 2023-12-28 ENCOUNTER — Other Ambulatory Visit: Payer: Self-pay | Admitting: Orthopaedic Surgery

## 2023-12-28 MED ORDER — TRAMADOL HCL 50 MG PO TABS: 50.0000 mg | ORAL_TABLET | Freq: Four times a day (QID) | ORAL | 0 refills | Status: DC | PRN

## 2023-12-28 NOTE — Telephone Encounter (Signed)
 Patient called and needs a refill on pain medication. CB#[phone]

## 2024-01-11 ENCOUNTER — Telehealth: Payer: Self-pay | Admitting: Orthopaedic Surgery

## 2024-01-11 NOTE — Telephone Encounter (Signed)
 Pt called and said if you could call her. Her R leg is giving her so much pain. I tried to schedule the next available and she said she can't wait. CB#[phone]

## 2024-01-14 ENCOUNTER — Encounter: Payer: Self-pay | Admitting: Physician Assistant

## 2024-01-14 ENCOUNTER — Other Ambulatory Visit: Payer: Self-pay

## 2024-01-14 ENCOUNTER — Ambulatory Visit: Admitting: Physician Assistant

## 2024-01-14 DIAGNOSIS — M25522 Pain in left elbow: Secondary | ICD-10-CM | POA: Diagnosis not present

## 2024-01-14 DIAGNOSIS — M545 Low back pain, unspecified: Secondary | ICD-10-CM

## 2024-01-14 DIAGNOSIS — M7061 Trochanteric bursitis, right hip: Secondary | ICD-10-CM | POA: Diagnosis not present

## 2024-01-14 MED ORDER — LIDOCAINE HCL 1 % IJ SOLN
3.0000 mL | INTRAMUSCULAR | Status: AC | PRN
  Administered 2024-01-14: 15:00:00 3 mL

## 2024-01-14 MED ORDER — METHYLPREDNISOLONE ACETATE 40 MG/ML IJ SUSP
40.0000 mg | INTRAMUSCULAR | Status: AC | PRN
  Administered 2024-01-14: 15:00:00 40 mg via INTRA_ARTICULAR

## 2024-01-14 NOTE — Progress Notes (Signed)
 Office Visit Note   Patient: Penny Henry           Date of Birth: 02-Aug-1940           MRN: [id number] Visit Date: 01/14/2024              Requested by: Penny Nest, MD [address] [place],  [place] [address] PCP: Penny Nest, MD   Assessment & Plan: Visit Diagnoses:  1. Low back pain, unspecified back pain laterality, unspecified chronicity, unspecified whether sciatica present   2. Pain in left elbow     Plan: Patient is a pleasant 83 year old woman who comes in with 2 complaints for she is complaining of history of right lateral hip pain since Thanksgiving.  She said this began when she was in a tight car space in her right lateral hip was pushed up against a child seat.  She denies any paresthesias any loss of bowel or bladder control any radiation of the pain.  Findings consistent with a trochanteric bursitis of the right hip.  Will go forward and inject that today. Also complains of a 1 day history of left elbow pain.  She said she had a fall while she was at [employer].  She did have some bruising.  Her exam is fairly benign she has good flexion extension good strength good supination pronation no fat pad sign on her x-rays she is mostly tender over the bursa but no sign of infection there is some bogginess to it.  I have encouraged her to wash this with Dial soap and water  twice a day and continue to move her elbow may follow-up as needed  Follow-Up Instructions: No follow-ups on file.   Orders:  Orders Placed This Encounter  Procedures   XR Lumbar Spine 2-3 Views   XR Elbow Complete Left (3+View)   No orders of the defined types were placed in this encounter.     Procedures: Large Joint Inj: R greater trochanter on 01/14/2024 2:45 PM Indications: pain and diagnostic evaluation Details: 25 G 1.5 in needle, lateral approach  Arthrogram: No  Medications: 3 mL lidocaine  1 %; 40 mg methylPREDNISolone  acetate 40 MG/ML Outcome: tolerated well, no  immediate complications Procedure, treatment alternatives, risks and benefits explained, specific risks discussed. Consent was given by the patient. Immediately prior to procedure a time out was called to verify the correct patient, procedure, equipment, support staff and site/side marked as required.       Clinical Data: No additional findings.   Subjective: Chief Complaint  Patient presents with   Lower Back - Pain   Left Elbow - Pain   Right Leg - Pain    HPI pleasant 83 year old woman presents today with a 2 to 3-week history of right lateral hip pain.  Also complaining of some left elbow pain after a fall yesterday.  Does not have any restrictions with motion in the elbow.  Does not have any paresthesias in her lower back has a history of low back issues however pain is pain is focal in the lateral side of her hip this was after sitting against child's car seat for a couple hours  Review of Systems  All other systems reviewed and are negative.    Objective: Vital Signs: There were no vitals taken for this visit.  Physical Exam Constitutional:      Appearance: Normal appearance.  Pulmonary:     Effort: Pulmonary effort is normal.     Breath sounds: Normal breath  sounds.  Skin:    General: Skin is warm and dry.  Neurological:     General: No focal deficit present.     Mental Status: She is alert and oriented to person, place, and time.     Ortho Exam examination of her right hip she is focally tender over the trochanteric bursa no weakness she has good dorsiflexion extension flexion of her legs no tenderness in the lower back or crepitus no step-offs.  She has no paresthesias.  No pain with manipulation of her hip just focally tender over the trope bursa  Specialty Comments:  No specialty comments available.  Imaging: XR Elbow Complete Left (3+View) Result Date: 01/14/2024 Radiographs of her right left elbow do not demonstrate any acute fractures no  XR Lumbar  Spine 2-3 Views Result Date: 01/14/2024 Radiographs of her lumbar spine demonstrate moderate degenerative changes with sclerotic changes and joint space narrowing    PMFS History: Patient Active Problem List   Diagnosis Date Noted   Status post total left knee replacement 09/29/2023   Fall at home, initial encounter 11/28/2021   Leukocytosis 11/28/2021   GAD (generalized anxiety disorder) 11/28/2021   Closed displaced fracture of neck of left femur (HCC) 11/27/2021   Nontraumatic complete tear of left rotator cuff 12/08/2019   Chronic left shoulder pain 12/08/2019   Chronic pain of left knee 10/14/2017   Stasis dermatitis 09/16/2017   Allergic rhinitis 09/08/2017   PVC (premature ventricular contraction) 08/03/2014   SVT (supraventricular tachycardia) 08/03/2014   Past Medical History:  Diagnosis Date   Acid reflux disease    Anxiety    Arthritis    Borderline hypercholesterolemia    Depression    Dysrhythmia    SVT/PVCs   Family history of adverse reaction to anesthesia    Son wakes up during surgery   Insomnia    Irritable bowel syndrome    Peripheral vascular disease     Family History  Problem Relation Age of Onset   Arthritis Mother    Arthritis Father     Past Surgical History:  Procedure Laterality Date   ANTERIOR FUSION CERVICAL SPINE  08/1999   Dr. Ozell Lyme at [facility], C3, C4, C5   APPENDECTOMY  06/06/1975   [facility]   CATARACT EXTRACTION W/ INTRAOCULAR LENS IMPLANT Left 03/21/2019   [facility] - Dr. Wanda   CATARACT EXTRACTION W/ INTRAOCULAR LENS IMPLANT Right 04/16/2020   [facility] - Dr. Wanda   KNEE ARTHROSCOPY Left 10/02/2011   Dr. Lonni Poli   LIPOMA EXCISION Left 04/10/2008   Left Upper Arm   TOTAL HIP ARTHROPLASTY Left 11/28/2021   Procedure: TOTAL HIP ARTHROPLASTY ANTERIOR APPROACH;  Surgeon: Poli Lonni GRADE, MD;  Location: [facility] OR;  Service: Orthopedics;   Laterality: Left;   TOTAL KNEE ARTHROPLASTY Left 09/29/2023   Procedure: ARTHROPLASTY, KNEE, TOTAL;  Surgeon: Poli Lonni GRADE, MD;  Location: [facility] OR;  Service: Orthopedics;  Laterality: Left;   Social History   Occupational History   Not on file  Tobacco Use   Smoking status: Never   Smokeless tobacco: Never  Vaping Use   Vaping status: Never Used  Substance and Sexual Activity   Alcohol use: Not Currently   Drug use: Not Currently   Sexual activity: Not Currently

## 2024-01-18 ENCOUNTER — Other Ambulatory Visit: Payer: Self-pay | Admitting: Orthopaedic Surgery

## 2024-02-15 ENCOUNTER — Telehealth: Payer: Self-pay | Admitting: Orthopaedic Surgery

## 2024-02-15 NOTE — Telephone Encounter (Signed)
 Pt called asking for a call from [place]. Pt states her leg is still hurting and need a call. Please call pt at [phone]

## 2024-02-16 NOTE — Telephone Encounter (Signed)
 Requesting a refill of Tramadol   States her ankle/foot is still swelling too just FYI

## 2024-02-25 ENCOUNTER — Other Ambulatory Visit (INDEPENDENT_AMBULATORY_CARE_PROVIDER_SITE_OTHER): Payer: Self-pay

## 2024-02-25 ENCOUNTER — Ambulatory Visit: Admitting: Physician Assistant

## 2024-02-25 ENCOUNTER — Telehealth: Payer: Self-pay | Admitting: Physician Assistant

## 2024-02-25 DIAGNOSIS — M25572 Pain in left ankle and joints of left foot: Secondary | ICD-10-CM

## 2024-02-25 MED ORDER — LIDOCAINE HCL 1 % IJ SOLN
0.5000 mL | INTRAMUSCULAR | Status: AC | PRN
  Administered 2024-02-25: .5 mL

## 2024-02-25 MED ORDER — TRAMADOL HCL 50 MG PO TABS: 50.0000 mg | ORAL_TABLET | Freq: Four times a day (QID) | ORAL | 0 refills | Status: AC | PRN

## 2024-02-25 MED ORDER — METHYLPREDNISOLONE ACETATE 40 MG/ML IJ SUSP
20.0000 mg | INTRAMUSCULAR | Status: AC | PRN
  Administered 2024-02-25: 20 mg via INTRA_ARTICULAR

## 2024-02-25 NOTE — Telephone Encounter (Signed)
 Pt forgot to asked for refill of tramadol . Pt is asking for Clark to send tramadol  ASAP to [facility]. Pt number is [phone]. Pt is asking for Rosina B to call when sent to pharmacy. Pt number is [phone].

## 2024-02-25 NOTE — Progress Notes (Signed)
 "  Office Visit Note   Patient: Penny Henry           Date of Birth: Jun 11, 1940           MRN: [id number] Visit Date: 02/25/2024              Requested by: Clemmie Nest, MD [address] [place],  [place] [address] PCP: Clemmie Nest, MD   Assessment & Plan: Visit Diagnoses:  1. Pain in left ankle and joints of left foot   2. Sinus tarsi syndrome of left ankle     Plan: Postinjection patient states that the pain in left ankle is improved.  She will obtain bilateral inserts for her shoes that should be pliable and have a hindfoot wedge.  Discussed shoewear with her.  She will follow-up with us  as needed.  Follow-Up Instructions: Return if symptoms worsen or fail to improve.   Orders:  Orders Placed This Encounter  Procedures   XR Ankle Complete Left   Meds ordered this encounter  Medications   traMADol  (ULTRAM ) 50 MG tablet    Sig: Take 1-2 tablets (50-100 mg total) by mouth every 6 (six) hours as needed.    Dispense:  30 tablet    Refill:  0      Procedures: Small Joint Inj: L subtalar on 02/25/2024 5:58 PM Indications: pain Medications: 0.5 mL lidocaine  1 %; 20 mg methylPREDNISolone  acetate 40 MG/ML      Clinical Data: No additional findings.   Subjective: Chief Complaint  Patient presents with   Left Ankle - Pain   Right Hip - Pain    HPI Penny Henry comes in today due to left ankle pain.  She states she had left ankle pain ongoing for some weeks now.  No known injury.  Notes that she has swelling in her left lower leg which she has been told that she has venous insufficiency and she is seeing vascular surgery for.  She is also undergone left knee replacement.  She has been told to wear compression hose and elevate her feet she states she cannot tolerate the compression hose.  Review of Systems Negative for fevers chills  Objective: Vital Signs: There were no vitals taken for this visit.  Physical Exam Constitutional:      Appearance:  She is normal weight. She is not ill-appearing or diaphoretic.  Cardiovascular:     Pulses: Normal pulses.  Pulmonary:     Effort: Pulmonary effort is normal.  Neurological:     Mental Status: She is alert and oriented to person, place, and time.  Psychiatric:        Mood and Affect: Mood normal.     Ortho Exam Bilateral feet: Dorsal pedal pulses are 2+ and equal and symmetric.  No tenderness over the Achilles bilaterally no Achilles injuries bilaterally.  5 out of 5 strength with inversion eversion against resistance bilaterally.  Pes planus valgum deformities bilaterally.  Tenderness over the left foot sinus Tarsi region only.  Good dorsiflexion plantarflexion bilateral ankles without pain. Lower remedies: Chronic changes bilateral lower extremities. Specialty Comments:  No specialty comments available.  Imaging: XR Ankle Complete Left Result Date: 02/25/2024 Left ankle 3 views: No acute fractures.  Ankle joint overall well-preserved.  Posterior impingement with osteophyte off the distal tibia.  Mild midfoot arthritic changes noted.  Talus well located within the ankle mortise no diastases.    PMFS History: Patient Active Problem List   Diagnosis Date Noted   Trochanteric bursitis,  right hip 01/14/2024   Status post total left knee replacement 09/29/2023   Fall at home, initial encounter 11/28/2021   Leukocytosis 11/28/2021   GAD (generalized anxiety disorder) 11/28/2021   Closed displaced fracture of neck of left femur (HCC) 11/27/2021   Nontraumatic complete tear of left rotator cuff 12/08/2019   Chronic left shoulder pain 12/08/2019   Chronic pain of left knee 10/14/2017   Stasis dermatitis 09/16/2017   Allergic rhinitis 09/08/2017   PVC (premature ventricular contraction) 08/03/2014   SVT (supraventricular tachycardia) 08/03/2014   Past Medical History:  Diagnosis Date   Acid reflux disease    Anxiety    Arthritis    Borderline hypercholesterolemia    Depression     Dysrhythmia    SVT/PVCs   Family history of adverse reaction to anesthesia    Son wakes up during surgery   Insomnia    Irritable bowel syndrome    Peripheral vascular disease     Family History  Problem Relation Age of Onset   Arthritis Mother    Arthritis Father     Past Surgical History:  Procedure Laterality Date   ANTERIOR FUSION CERVICAL SPINE  08/1999   [facility] Ozell [facility]yme [facility], C3, C4, C5   APPENDECTOMY  06/06/1975   [facility]   CATARACT EXTRACTION W/ INTRAOCULAR LENS IMPLANT Left 03/21/2019   [facility] - Dr. Wanda   CATARACT EXTRACTION W/ INTRAOCULAR LENS IMPLANT Right 04/16/2020   [facility] - Dr. Wanda   KNEE ARTHROSCOPY Left 10/02/2011   Dr. Lonni Poli   LIPOMA EXCISION Left 04/10/2008   Left Upper Arm   TOTAL HIP ARTHROPLASTY Left 11/28/2021   Procedure: TOTAL HIP ARTHROPLASTY ANTERIOR APPROACH;  Surgeon: Poli Lonni GRADE, MD;  Location: [facility] OR;  Service: Orthopedics;  Laterality: Left;   TOTAL KNEE ARTHROPLASTY Left 09/29/2023   Procedure: ARTHROPLASTY, KNEE, TOTAL;  Surgeon: Poli Lonni GRADE, MD;  Location: [facility] OR;  Service: Orthopedics;  Laterality: Left;   Social History   Occupational History   Not on file  Tobacco Use   Smoking status: Never   Smokeless tobacco: Never  Vaping Use   Vaping status: Never Used  Substance and Sexual Activity   Alcohol use: Not Currently   Drug use: Not Currently   Sexual activity: Not Currently        "

## 2024-05-16 ENCOUNTER — Ambulatory Visit: Admitting: Orthopaedic Surgery
